# Patient Record
Sex: Female | Born: 1955 | Race: Asian | Hispanic: No | Marital: Single | Smoking: Never smoker
Health system: Southern US, Community
[De-identification: ages and names within clinical notes are randomized; demographics above are authoritative.]

## PROBLEM LIST (undated history)

## (undated) DIAGNOSIS — E7800 Pure hypercholesterolemia, unspecified: Secondary | ICD-10-CM

## (undated) DIAGNOSIS — I10 Essential (primary) hypertension: Secondary | ICD-10-CM

---

## 2010-09-08 ENCOUNTER — Emergency Department (HOSPITAL_COMMUNITY): Payer: Self-pay

## 2010-09-08 ENCOUNTER — Inpatient Hospital Stay (HOSPITAL_COMMUNITY)
Admission: EM | Admit: 2010-09-08 | Discharge: 2010-09-10 | DRG: 305 | Disposition: A | Discharge status: Home / Self Care | Payer: Self-pay | Attending: Family Medicine | Admitting: Family Medicine

## 2010-09-08 ENCOUNTER — Encounter (HOSPITAL_COMMUNITY): Payer: Self-pay | Admitting: Radiology

## 2010-09-08 DIAGNOSIS — E871 Hypo-osmolality and hyponatremia: Secondary | ICD-10-CM

## 2010-09-08 DIAGNOSIS — R5381 Other malaise: Secondary | ICD-10-CM | POA: Diagnosis present

## 2010-09-08 DIAGNOSIS — F1010 Alcohol abuse, uncomplicated: Secondary | ICD-10-CM | POA: Diagnosis present

## 2010-09-08 DIAGNOSIS — I10 Essential (primary) hypertension: Secondary | ICD-10-CM

## 2010-09-08 DIAGNOSIS — H538 Other visual disturbances: Secondary | ICD-10-CM | POA: Diagnosis present

## 2010-09-08 DIAGNOSIS — E876 Hypokalemia: Secondary | ICD-10-CM

## 2010-09-08 DIAGNOSIS — R42 Dizziness and giddiness: Secondary | ICD-10-CM | POA: Diagnosis present

## 2010-09-08 DIAGNOSIS — R5383 Other fatigue: Secondary | ICD-10-CM | POA: Diagnosis present

## 2010-09-08 LAB — DIFFERENTIAL
Basophils Relative: 0 % (ref 0–1)
Eosinophils Relative: 0 % (ref 0–5)
Lymphocytes Relative: 16 % (ref 12–46)
Monocytes Relative: 4 % (ref 3–12)
Neutrophils Relative %: 80 % — ABNORMAL HIGH (ref 43–77)

## 2010-09-08 LAB — URINALYSIS, ROUTINE W REFLEX MICROSCOPIC
Glucose, UA: 250 mg/dL — AB
Ketones, ur: NEGATIVE mg/dL
Protein, ur: NEGATIVE mg/dL
Urobilinogen, UA: 0.2 mg/dL (ref 0.0–1.0)

## 2010-09-08 LAB — CBC
HCT: 35.7 % — ABNORMAL LOW (ref 36.0–46.0)
Hemoglobin: 12.8 g/dL (ref 12.0–15.0)
MCV: 74.4 fL — ABNORMAL LOW (ref 78.0–100.0)
Platelets: 231 10*3/uL (ref 150–400)
RBC: 4.8 MIL/uL (ref 3.87–5.11)
WBC: 10.1 10*3/uL (ref 4.0–10.5)

## 2010-09-08 LAB — COMPREHENSIVE METABOLIC PANEL
AST: 36 U/L (ref 0–37)
BUN: 8 mg/dL (ref 6–23)
CO2: 19 mEq/L (ref 19–32)
Chloride: 82 mEq/L — ABNORMAL LOW (ref 96–112)
Creatinine, Ser: <0.47 mg/dL — ABNORMAL LOW (ref 0.50–1.10)
Glucose, Bld: 112 mg/dL — ABNORMAL HIGH (ref 70–99)
Total Bilirubin: 0.5 mg/dL (ref 0.3–1.2)

## 2010-09-08 LAB — TROPONIN I: Troponin I: <0.3 ng/mL (ref <0.30)

## 2010-09-08 LAB — PREGNANCY, URINE: Preg Test, Ur: NEGATIVE

## 2010-09-09 LAB — COMPREHENSIVE METABOLIC PANEL
BUN: 4 mg/dL — ABNORMAL LOW (ref 6–23)
CO2: 20 mEq/L (ref 19–32)
Chloride: 97 mEq/L (ref 96–112)
Creatinine, Ser: 0.56 mg/dL (ref 0.50–1.10)
GFR calc non Af Amer: >60 mL/min (ref >60)
Glucose, Bld: 115 mg/dL — ABNORMAL HIGH (ref 70–99)
Total Bilirubin: 0.7 mg/dL (ref 0.3–1.2)

## 2010-09-09 LAB — PRO B NATRIURETIC PEPTIDE: Pro B Natriuretic peptide (BNP): 365.2 pg/mL — ABNORMAL HIGH (ref 0–125)

## 2010-09-09 LAB — CBC
MCH: 26.5 pg (ref 26.0–34.0)
MCV: 74.3 fL — ABNORMAL LOW (ref 78.0–100.0)
Platelets: 254 10*3/uL (ref 150–400)
RDW: 13.1 % (ref 11.5–15.5)
WBC: 8.3 10*3/uL (ref 4.0–10.5)

## 2010-09-09 LAB — TROPONIN I: Troponin I: <0.3 ng/mL (ref <0.30)

## 2010-09-09 LAB — BASIC METABOLIC PANEL
BUN: 11 mg/dL (ref 6–23)
Calcium: 9.4 mg/dL (ref 8.4–10.5)
GFR calc non Af Amer: >60 mL/min (ref >60)
Glucose, Bld: 109 mg/dL — ABNORMAL HIGH (ref 70–99)

## 2010-09-09 LAB — CARDIAC PANEL(CRET KIN+CKTOT+MB+TROPI): Troponin I: <0.3 ng/mL (ref <0.30)

## 2010-09-09 LAB — TSH: TSH: 1.158 u[IU]/mL (ref 0.350–4.500)

## 2010-09-09 LAB — CK TOTAL AND CKMB (NOT AT ARMC): Relative Index: 2.4 (ref 0.0–2.5)

## 2010-09-10 LAB — BASIC METABOLIC PANEL
BUN: 16 mg/dL (ref 6–23)
CO2: 23 mEq/L (ref 19–32)
Calcium: 9.7 mg/dL (ref 8.4–10.5)
GFR calc non Af Amer: >60 mL/min (ref >60)
Glucose, Bld: 101 mg/dL — ABNORMAL HIGH (ref 70–99)
Potassium: 4.9 mEq/L (ref 3.5–5.1)

## 2010-09-12 LAB — HCV RNA QUANT

## 2010-09-15 NOTE — H&P (Signed)
Molly Rodriguez, Molly Rodriguez                  ACCOUNT NO.:  [id number]  MEDICAL RECORD NO.:  [id number]  LOCATION:  3735                         FACILITY:  [facility]  PHYSICIAN:  Leighton Roach Lars Jeziorski, M.D.DATE OF BIRTH:  1955-11-08  DATE OF ADMISSION:  09/08/2010 DATE OF DISCHARGE:                             HISTORY & PHYSICAL   PRIMARY CARE PHYSICIAN:  [facility] of [address].  CHIEF COMPLAINT:  Dizziness and blurred vision.  HISTORY OF PRESENT ILLNESS:  This is a 55 year old female with history of apparent health that today in the morning around 10:00 a.m. started to feel dizzy and with blurred vision.  Also complained of generalized weakness not attributable to any particular area of her body.  Felt nauseated, but did not vomit.  No chest pain, no shortness of breath, no headaches.  She was brought by her son-in-law and was found to have blood pressure systolics around 200 and diastolic around 110, and we decided to admit her for further monitoring, study and treatment.  PAST MEDICAL HISTORY:  History of good health and one previous episode of dizziness last year, no related to high blood pressure and no further study.  GYNECOLOGICAL HISTORY:  Last menstrual period in June 2011, G3 P3 A0.  PAST SURGICAL HISTORY:  None.  SOCIAL HISTORY:  The patient lives with her daughter.  She does not work.  Tobacco never.  Alcohol, about 10 cans of beer per week.  Drugs, none.  FAMILY HISTORY:  Mother and father dead, and she does not know the cause.  ALLERGIES:  NKDA.  MEDICATION:  Vitamins.  REVIEW OF SYSTEMS:  Positive for nausea and please refer to the HPI.  PHYSICAL EXAMINATION:  VITAL SIGNS:  Temperature 97, pulse 86, respirations 18, blood pressure between 202-179 systolic and diastolic between 409-81, oxygen saturation 100% on room air. GENERAL:  NAD. HEENT:  PERL, EOMI.  Funduscopy, no hemorrhage or papilledema seen. NECK:  Supple.  No adenopathy. CARDIOVASCULAR:  RRR.  No  murmurs. LUNGS:  Breath sounds normal bilaterally.  No rales. ABDOMEN:  Soft, nontender, nondistended. EXTREMITIES:  No edema. NEURO:  Oriented x3.  Cranial nerves II through XII normal.  Strength 5/5.  Sensation normal.  Reflexes present and normal. Coordination, finger-to-nose, finger-to-finger, and rapid alternating movements conserved.  Did not stand up because of her dizziness.  LABORATORY DATA AND STUDIES:  Hemoglobin is 12.8, white count is 10.1, platelets is 231.  Sodium 120, potassium 3.1, chloride 82, CO2 of 19, BUN 8, creatinine 0.47, glucose 112, calcium 8.1, albumin 4.2.  Cardiac enzymes, troponin, and CK-MB x1 negative.  Urine negative and urine pregnancy test negative.  CT without contrast, normal appearing.  No evidence of hemorrhage, infarction, or mass lesion.  Chest x-ray, mild pulmonary vascular congestion.  ASSESSMENT AND PLAN:  This is a 55 year old female with a high blood pressure and dizziness. 1. Hypertensive urgency.  Blood pressure 200/110  mmHg.  Even though,     the patient has symptoms of dizziness and blurred vision, her     neurologic exam was negative for signs of focalization.  Her     funduscopic exam was negative.  CT scan was normal.  She  never had     chest pain and enzymes x1 were negative.  EKG showed borderline     depression of ST on lateral lead, but no prior EKGs and this could     be due to heart strain for her blood pressure.  Renal function     adequate.  Chest x-ray negative.  We will monitor her BP and start     antihypertensive treatment for blood pressure goals around 20%     reduction. 2. Hypertension plus hypokalemia.  We will include our differential as     primary aldosteronism, but first, we will replete potassium and     monitor.  We will assess if further studies are needed. 3. Hyponatremia combined with hypokalemia.  There are no signs of     fluid overload or losses.  We will replete sodium with normal     saline and  reevaluate.  Could be associated with SIADH or primary     polydipsia (euvolemic hyponatremia or compensatory for primary     hypoaldosteronism). 4. EtOH tox related to alcohol.  The patient drinks beer in frequency     enough to assess this in our plan.  CIWA protocol was started and     liver function test for tomorrow.  Platelets are normal. 5. Fluids, electrolytes, nutrition/gastrointestinal.  Heart-healthy     diet and normal saline at 100 mL per hour. 6. Prophylaxis.  Heparin subcu. 7. Disposition.  Pending improvement of symptoms and blood pressure     management.    ______________________________ [name], MD   ______________________________ Leighton Roach Jawon Dipiero, M.D.    DP/MEDQ  D:  09/09/2010  T:  09/09/2010  Job:  [id number]  Electronically Signed by Delorse Lek PAZ  on 09/12/2010 11:44:52 PM Electronically Signed by Acquanetta Belling M.D. on 09/15/2010 04:45:41 PM

## 2010-09-15 NOTE — Discharge Summary (Signed)
NAMEDESPINA, BOAN                  ACCOUNT NO.:  [id number]  MEDICAL RECORD NO.:  [id number]  LOCATION:  3735                         FACILITY:  [facility]  PHYSICIAN:  Leighton Roach Kenzee Bassin, M.D.DATE OF BIRTH:  [birth year]  DATE OF ADMISSION:  09/08/2010 DATE OF DISCHARGE:  09/10/2010                              DISCHARGE SUMMARY   DISCHARGE DIAGNOSES: 1. Hypertensive urgency. 2. Hypokalemia and hyponatremia. 3. History of alcohol abuse.  DISCHARGE MEDICATIONS: 1. Thiamine 100 mg tablet one p.o. daily. 2. Folic acid 1 mg tablet p.o. daily. 3. Fish oil OTC.  CONSULTS:  None.  LABS: 1. Sodium 120-137 today, potassium 3.1-4.6 today, A1c 5.6, TSH 1.158,     hepatitis B indeterminate. 2. Pro BNP 365. 3. Cardiac enzymes negative x3. 4. Urinary pregnancy test negative. 5. Uterine microscopy negative. 6. Osmolality on serum 261, on urine 356.  PERTINENT STUDIES: 1. CT of the head without contrast performed on July 13 which showed     normal brain. 2. Chest x-ray performed July 13 showed mild pulmonary vascular     congestion and clear lungs.  BRIEF HOSPITAL COURSE:  This is a 55 year old female, apparently healthy in the past that is admitted for dizziness and blood pressure of 200/110 mgHg. 1. Hypertensive emergency.  The patient without signs of organ     dysfunction.  CT of head, chest x-ray, and cardiac enzymes are all     negative.  Blood pressure responded well to metoprolol (two doses     of 25 mg) with adequate reduction to normal value by the second day     of hospitalization.  The patient was started on lisinopril 10 mg on     July 14 that was stopped the next day due to stabilization of her     blood pressure.  We discharge her and explained she needed to     monitor blood pressure at least twice a day and follow up as     outpatient for better control. 2. Hypokalemia, hyponatremia, with low osmolality of serum and urine.     Responded well to normal saline and  potassium repletion with two     BMET of normal values of 9 hours apart.  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 3. History of alcohol abuse.  It was very hard to obtain a good     history.  The patient was CAGE positive, but then she said that she     used to drink in the past, not now.  She was on CIWA protocol and     is sent home with thiamine and folic acid.  These could be assessed     as an outpatient with more detail. 4. Hepatitis B surface antibody indeterminate.  We would recommend     more extensive hepatitis panel.  DISCHARGE INSTRUCTIONS: 1. Activity:  Return to daily activities. 2. Diet:  Heart healthy diet. 3. Appointment with her PCP at [facility] or at [facility]  Practice Service if she register with [place].  The patient     is discharged home on stable medical condition.    ______________________________ Wayne Both, MD   ______________________________ Etta Grandchild, M.D.    DP/MEDQ  D:  09/11/2010  T:  09/11/2010  Job:  [id number]  Electronically Signed by Delorse Lek PAZ  on 09/12/2010 11:46:34 PM Electronically Signed by Acquanetta Belling M.D. on 09/15/2010 04:46:19 PM

## 2010-11-06 ENCOUNTER — Ambulatory Visit (INDEPENDENT_AMBULATORY_CARE_PROVIDER_SITE_OTHER): Payer: Self-pay | Admitting: Family Medicine

## 2010-11-06 ENCOUNTER — Encounter: Payer: Self-pay | Admitting: Family Medicine

## 2010-11-06 VITALS — BP 173/99 | HR 90 | Temp 98.4°F | Ht 59.6 in | Wt 107.0 lb

## 2010-11-06 DIAGNOSIS — I10 Essential (primary) hypertension: Secondary | ICD-10-CM

## 2010-11-06 DIAGNOSIS — R300 Dysuria: Secondary | ICD-10-CM

## 2010-11-06 DIAGNOSIS — Z23 Encounter for immunization: Secondary | ICD-10-CM

## 2010-11-06 LAB — POCT URINALYSIS DIPSTICK
Bilirubin, UA: NEGATIVE
Blood, UA: NEGATIVE
Nitrite, UA: NEGATIVE
Urobilinogen, UA: 0.2
pH, UA: 7

## 2010-11-06 MED ORDER — HYDROCHLOROTHIAZIDE 25 MG PO TABS: 25.0000 mg | ORAL_TABLET | Freq: Every day | ORAL | Status: DC

## 2010-11-06 NOTE — Patient Instructions (Addendum)
It has been a pleasure to see you again! Please take Hydrochlorothiazide 25 mg daily. Make a lab appointment and once I receive the results I will call you if they are abnormal, otherwise you will receive a letter. Make a f/u appointment in a month.

## 2010-11-12 DIAGNOSIS — R300 Dysuria: Secondary | ICD-10-CM | POA: Insufficient documentation

## 2010-11-12 NOTE — Assessment & Plan Note (Signed)
Dysuria for 3 weeks. No flank pain, no fever or chills. Plan: Urinalysis  (dip stick)

## 2010-11-12 NOTE — Assessment & Plan Note (Addendum)
Had a admission in [facility] on July/12 due to Hypertensive Urgency. Did not have PCP. Stablish care today with [place]. HTN. Plan: Hydrochlorothiazide 25 mg daily. Cmet, Lipid Profile. Monitor BP every other day for two weeks. F/u in 4  weeks

## 2010-11-12 NOTE — Progress Notes (Signed)
  Subjective:    Patient ID: Molly Rodriguez, female    DOB: 08-02-1955, 55 y.o.   MRN: [id number]  HPI Pt that was admitted last July for Hypertensive Urgency. (200/110 with no signs of organ damage). That is establishing care with [place] today (no prior PCP). Has history of Etoh use but stopped after her hospitalization. In ED BP responded to beta-blockers. Once admitted she was started on lisinopril  but she responded with low blood pressures and stopping medication was required. Pt maintain normal BP without drug therapy and was discharge home.  She comes today c/o dysuria for about 3 weeks, afebrile, no chills, no abdominal or back pain. Has taken her BP but monitor was not reliable.  BP today is 173/99 and 10 min later 160/ 100. No chest pain, palpitations, SOB, vomiting or change in her BM habits. No changes in appetite or in her weight.  Review of Systems Please refer to HPI    Objective:   Physical Exam  Constitutional: She is oriented to person, place, and time. No distress.  HENT:  Head: Normocephalic and atraumatic.  Eyes: EOM are normal. Pupils are equal, round, and reactive to light.  Neck: Neck supple. No thyromegaly present.  Cardiovascular: Normal rate, regular rhythm and normal heart sounds.   No murmur heard. Pulmonary/Chest: Breath sounds normal.  Abdominal: Soft. Bowel sounds are normal. She exhibits no distension and no mass. There is no tenderness.  Genitourinary:       No costovertebral tenderness  Musculoskeletal: She exhibits no edema.  Neurological: She is alert and oriented to person, place, and time. No cranial nerve deficit. Coordination normal.  Psychiatric: She has a normal mood and affect. Her behavior is normal.          Assessment & Plan:

## 2010-11-13 ENCOUNTER — Encounter: Payer: Self-pay | Admitting: Family Medicine

## 2010-11-15 ENCOUNTER — Other Ambulatory Visit: Payer: Self-pay

## 2011-03-05 ENCOUNTER — Other Ambulatory Visit: Payer: Self-pay | Admitting: Family Medicine

## 2011-03-06 NOTE — Telephone Encounter (Signed)
Refill request

## 2011-06-04 ENCOUNTER — Other Ambulatory Visit: Payer: Self-pay | Admitting: Family Medicine

## 2011-09-01 ENCOUNTER — Other Ambulatory Visit: Payer: Self-pay | Admitting: Family Medicine

## 2011-11-29 ENCOUNTER — Other Ambulatory Visit: Payer: Self-pay | Admitting: Family Medicine

## 2011-12-05 IMAGING — CT CT HEAD W/O CM
1 of 2 series · 13 of 30 positions shown, 17 images · non-contrast
Comparison: None

CLINICAL DATA: Dizziness and blurred vision

CT HEAD WITHOUT CONTRAST
TECHNIQUE: Contiguous axial images were obtained from the base of
the skull through the vertex without contrast.

[Series 2: brain · axial · 0.47mm/px · z∈[+121,+237]mm · 13 of 36 slices shown, 17 images]
[im 3/36  brain]
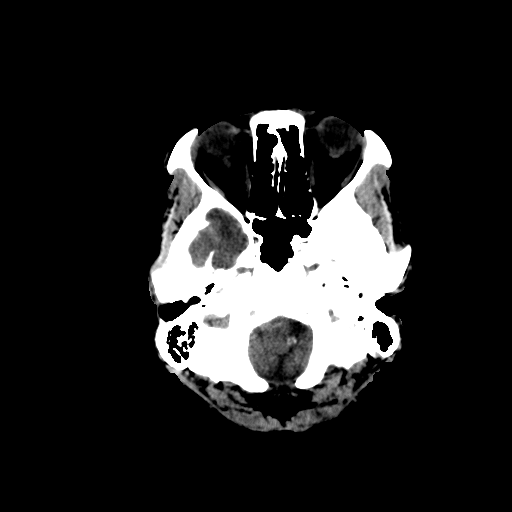
[im 3/36  bone]
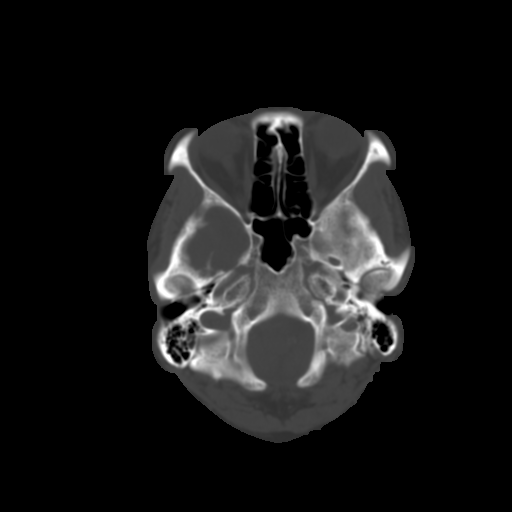
[im 6/36  brain]
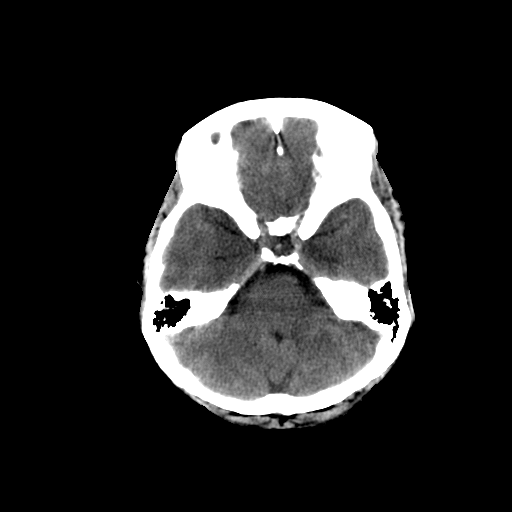
[im 8/36  brain]
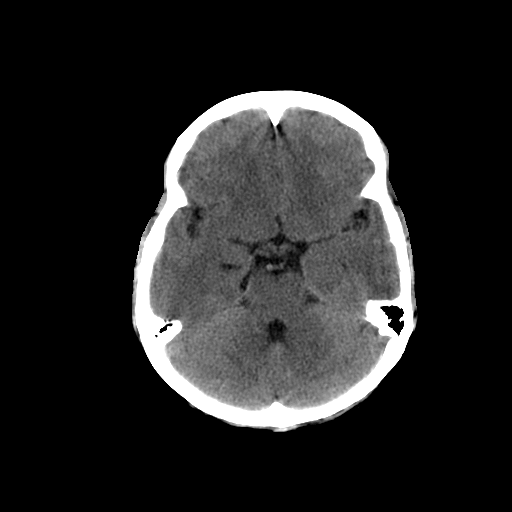
[im 11/36  brain]
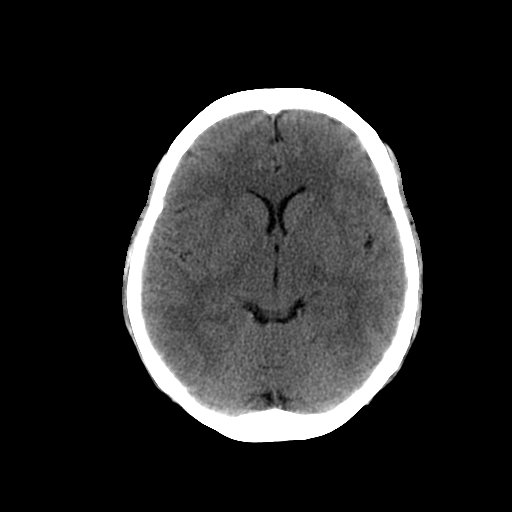
[im 13/36  brain]
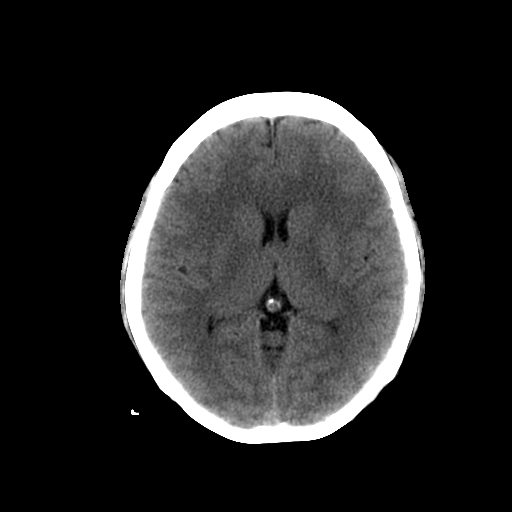
[im 13/36  bone]
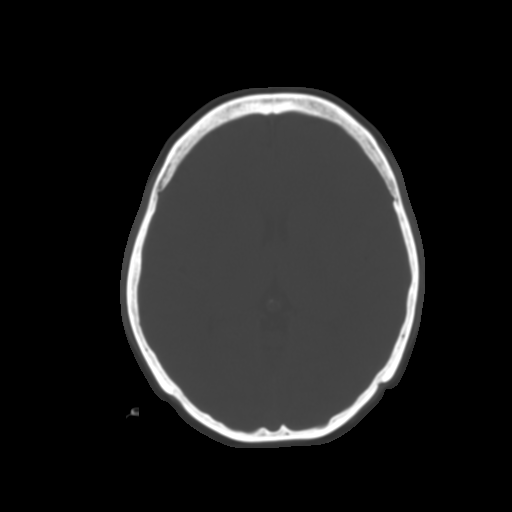
[im 16/36  brain]
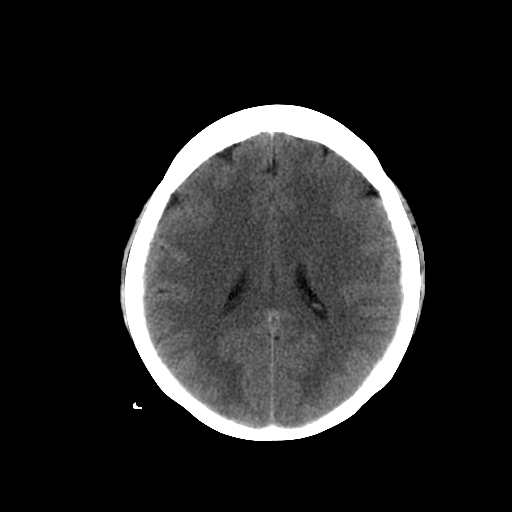
[im 18/36  brain]
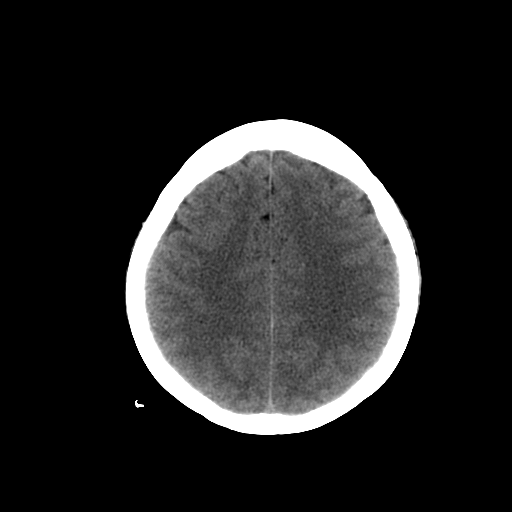
[im 21/36  brain]
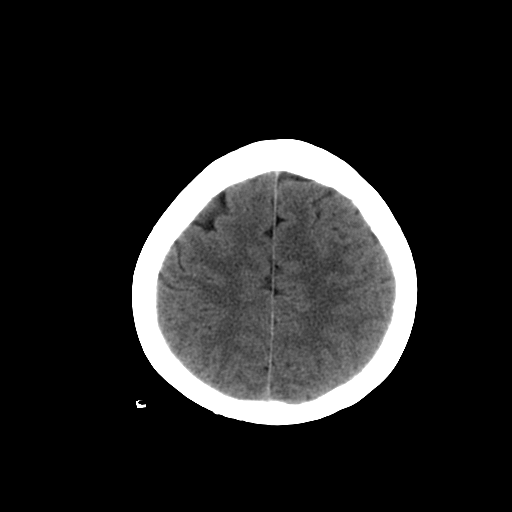
[im 23/36  brain]
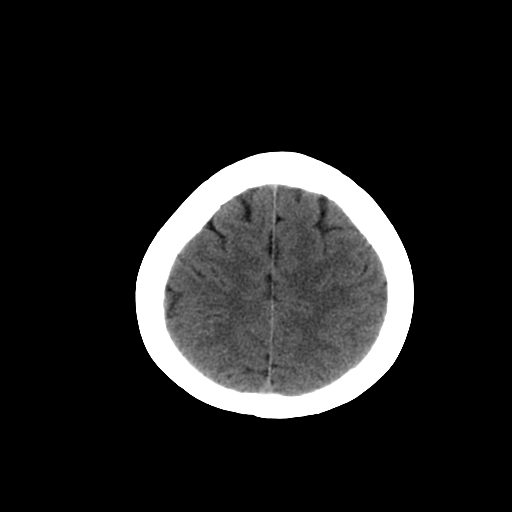
[im 23/36  bone]
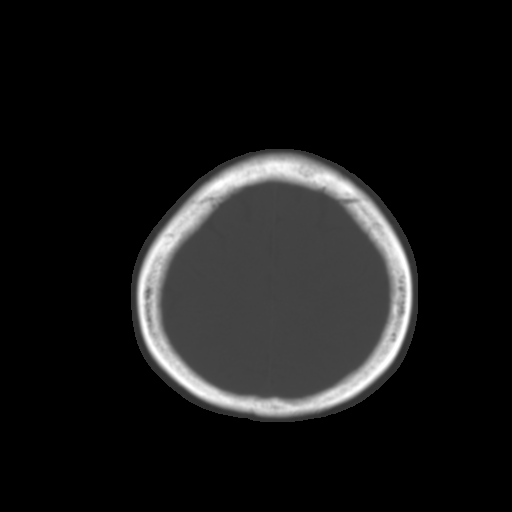
[im 26/36  brain]
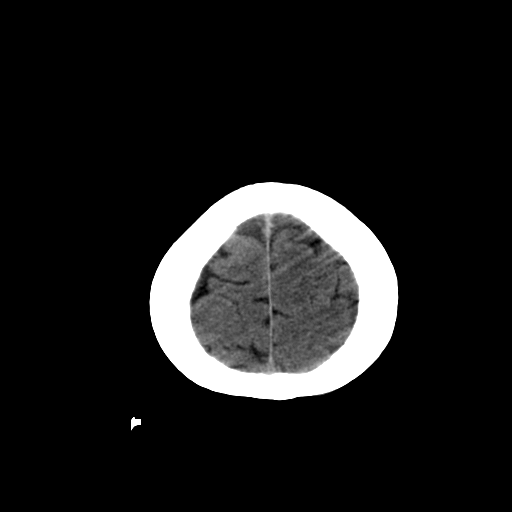
[im 28/36  brain]
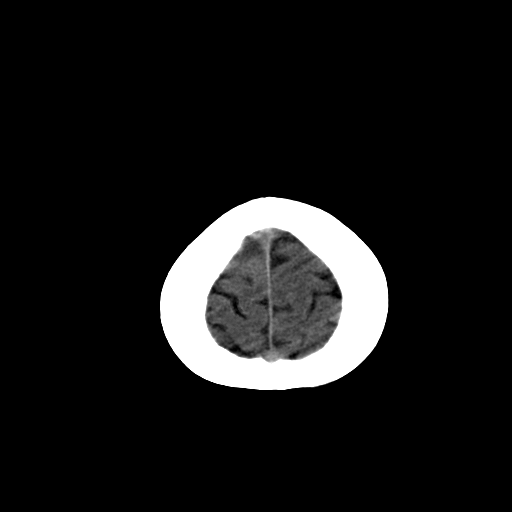
[im 31/36  brain]
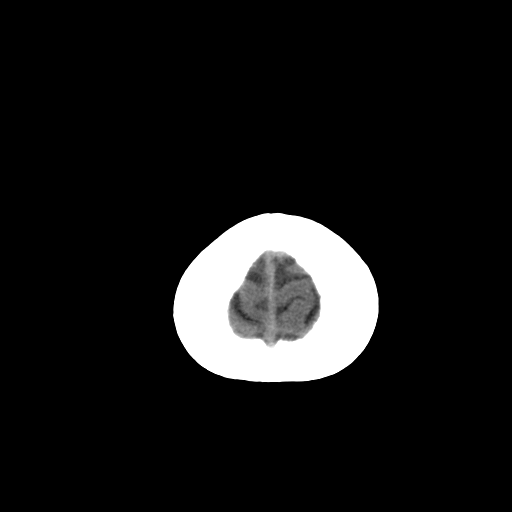
[im 33/36  brain]
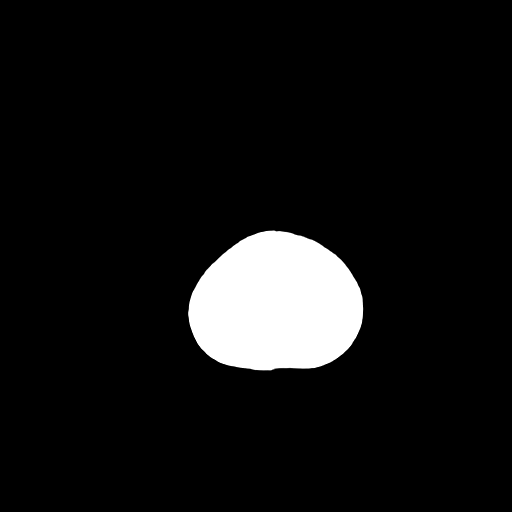
[im 33/36  bone]
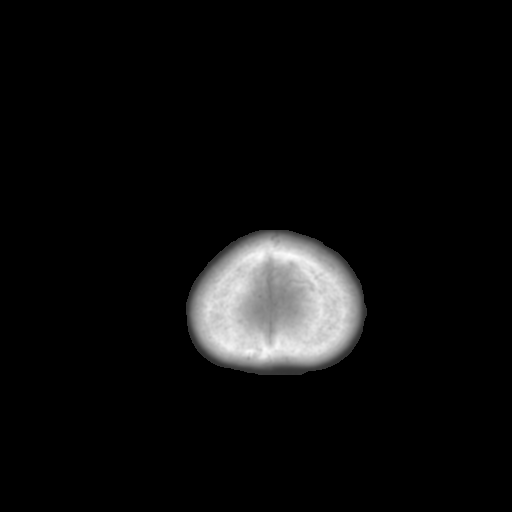

[13 of 30 positions shown; findings below may reference images not displayed]

FINDINGS: The brain has a normal appearance without evidence for
hemorrhage, infarction, hydrocephalus, or mass lesion.  There is no
extra axial fluid collection.  The skull and paranasal sinuses are
normal.
IMPRESSION: Normal brain.

## 2013-03-30 ENCOUNTER — Ambulatory Visit: Payer: Self-pay | Attending: Internal Medicine

## 2015-11-14 ENCOUNTER — Encounter: Payer: Self-pay | Admitting: Family Medicine

## 2015-11-14 ENCOUNTER — Ambulatory Visit (INDEPENDENT_AMBULATORY_CARE_PROVIDER_SITE_OTHER): Payer: Self-pay | Admitting: Family Medicine

## 2015-11-14 VITALS — BP 197/93 | HR 112 | Temp 98.3°F | Resp 16 | Ht 60.0 in | Wt 107.0 lb

## 2015-11-14 DIAGNOSIS — Z23 Encounter for immunization: Secondary | ICD-10-CM

## 2015-11-14 DIAGNOSIS — I10 Essential (primary) hypertension: Secondary | ICD-10-CM

## 2015-11-14 DIAGNOSIS — R05 Cough: Secondary | ICD-10-CM

## 2015-11-14 DIAGNOSIS — R059 Cough, unspecified: Secondary | ICD-10-CM

## 2015-11-14 DIAGNOSIS — Z1239 Encounter for other screening for malignant neoplasm of breast: Secondary | ICD-10-CM

## 2015-11-14 LAB — LIPID PANEL
Cholesterol: 205 mg/dL — ABNORMAL HIGH (ref 125–200)
HDL: 87 mg/dL (ref >=46)
LDL CALC: 86 mg/dL (ref <130)
TRIGLYCERIDES: 161 mg/dL — AB (ref <150)
Total CHOL/HDL Ratio: 2.4 Ratio (ref <=5.0)
VLDL: 32 mg/dL — AB (ref <30)

## 2015-11-14 MED ORDER — LISINOPRIL 20 MG PO TABS: 20.0000 mg | ORAL_TABLET | Freq: Every day | ORAL | 0 refills | Status: DC

## 2015-11-14 NOTE — Patient Instructions (Addendum)
Come back in one month for nurse visit for blood pressure check Take HCTZ and lisinopril daily for high blood pressure.  Watch salt in diet Work on getting [facility] [facility] so we can send to Ear, nose, throat doctor about the cough.

## 2015-11-15 LAB — HIV ANTIBODY (ROUTINE TESTING W REFLEX): HIV 1&2 Ab, 4th Generation: NONREACTIVE

## 2015-11-15 LAB — HEMOGLOBIN A1C
Hgb A1c MFr Bld: 5.6 % (ref <5.7)
Mean Plasma Glucose: 114 mg/dL

## 2015-11-16 NOTE — Progress Notes (Signed)
Molly Rodriguez, is a 60 y.o. female  ZOX:[id number]  [id number]  DOB - 05/15/1955  CC:  Chief Complaint  Patient presents with  . Establish Care  . Hypertension    taken medication at 7am.   . Cough    dry cough        HPI: Molly Rodriguez is a 60 y.o. female here to establish care. She has a diagnosis of hypertension and is on HCTZ 25 mg. Her BP today is 201/105. A repeat pressure was 197/93. She denies any further chronic illnesses but does complain of a chronic cough that originates in the lower throat. This has been present for a couple of years. She is unaware of what makes it better or worse. She denies heartburn.   Health maintenance: She will be given flu shot today. Is unaware of Tdap status. She needs screening for HIV and HCV. She needs a mammogram and a PAP smear.She reports drinking 1-2 beers a day. Denies tobacco or drug use. She also needs colon cancer screening  No Known Allergies History reviewed. No pertinent past medical history. Current Outpatient Prescriptions on File Prior to Visit  Medication Sig Dispense Refill  . hydrochlorothiazide (HYDRODIURIL) 25 MG tablet TAKE 1 TABLET BY MOUTH EVERY DAY 30 tablet 2   No current facility-administered medications on file prior to visit.    History reviewed. No pertinent family history. Social History   Social History  . Marital status: Single    Spouse name: N/A  . Number of children: N/A  . Years of education: N/A   Occupational History  . Not on file.   Social History Main Topics  . Smoking status: Never Smoker  . Smokeless tobacco: Never Used  . Alcohol use 1.2 oz/week    2 Cans of beer per week     Comment: daily  . Drug use: No  . Sexual activity: Yes    Birth control/ protection: Post-menopausal   Other Topics Concern  . Not on file   Social History Narrative  . No narrative on file    Review of Systems: Constitutional: Negative Skin: Negative HENT: Negative  Eyes: Negative  Neck:  Negative Respiratory: Negative Cardiovascular: Negative Gastrointestinal: Negative Genitourinary: Negative  Musculoskeletal: Negative   Neurological: Negative for Hematological: Negative  Psychiatric/Behavioral: Negative    Objective:   Vitals:   11/14/15 1105 11/14/15 1150  BP: (!) 201/105 (!) 197/93  Pulse:    Resp:    Temp:      Physical Exam: Constitutional: Patient appears well-developed and well-nourished. No distress. HENT: Normocephalic, atraumatic, External right and left ear normal. Oropharynx is clear and moist.  Eyes: Conjunctivae and EOM are normal. PERRLA, no scleral icterus. Neck: Normal ROM. Neck supple. No lymphadenopathy, No thyromegaly. CVS: RRR, S1/S2 +, no murmurs, no gallops, no rubs Pulmonary: Effort and breath sounds normal, no stridor, rhonchi, wheezes, rales.  Abdominal: Soft. Normoactive BS,, no distension, tenderness, rebound or guarding.  Musculoskeletal: Normal range of motion. No edema and no tenderness.  Neuro: Alert.Normal muscle tone coordination. Non-focal Skin: Skin is warm and dry. No rash noted. Not diaphoretic. No erythema. No pallor. Psychiatric: Normal mood and affect. Behavior, judgment, thought content normal.  Lab Results  Component Value Date   WBC 8.3 09/09/2010   HGB 12.8 09/09/2010   HCT 35.9 (L) 09/09/2010   MCV 74.3 (L) 09/09/2010   PLT 254 09/09/2010   Lab Results  Component Value Date   CREATININE 0.75 09/10/2010   BUN 16 09/10/2010  NA 139 09/10/2010   K 4.9 09/10/2010   CL 106 09/10/2010   CO2 23 09/10/2010    Lab Results  Component Value Date   HGBA1C 5.6 11/14/2015   Lipid Panel     Component Value Date/Time   CHOL 205 (H) 11/14/2015 1150   TRIG 161 (H) 11/14/2015 1150   HDL 87 11/14/2015 1150   CHOLHDL 2.4 11/14/2015 1150   VLDL 32 (H) 11/14/2015 1150   LDLCALC 86 11/14/2015 1150       Assessment and plan:   1. Encounter for immunization  - Flu Vaccine QUAD 36+ mos IM  2. Essential  hypertension  - Hemoglobin A1c - Lipid panel - HIV antibody (with reflex) -Addition of lisinopril 20 mg, q day.When she completes her current supply of hctz, will provide combination.  3. Screening for breast cancer  - MM DIGITAL SCREENING BILATERAL; Future  4. Colon cancer screening -stool cards.      Return in about 4 weeks (around 12/12/2015) for for nurse visit for BP check., HTN.  The patient was given clear instructions to go to ER or return to medical center if symptoms don't improve, worsen or new problems develop. The patient verbalized understanding.    Henrietta Hoover FNP  11/16/2015, 8:13 AM

## 2015-12-12 ENCOUNTER — Ambulatory Visit (INDEPENDENT_AMBULATORY_CARE_PROVIDER_SITE_OTHER): Payer: Self-pay | Admitting: Family Medicine

## 2015-12-12 VITALS — BP 191/109 | HR 118

## 2015-12-12 DIAGNOSIS — I10 Essential (primary) hypertension: Secondary | ICD-10-CM

## 2015-12-12 MED ORDER — LISINOPRIL-HYDROCHLOROTHIAZIDE 20-25 MG PO TABS: 1.0000 | ORAL_TABLET | Freq: Every day | ORAL | 1 refills | Status: DC

## 2015-12-12 MED ORDER — AMLODIPINE BESYLATE 10 MG PO TABS: 10.0000 mg | ORAL_TABLET | Freq: Every day | ORAL | 1 refills | Status: DC

## 2015-12-12 NOTE — Progress Notes (Signed)
Patient states she took both of her medications this morning at 7 am. She needs a refill of her lisinopril.   Advised patient per Concepcion LivingLinda Bernhardt, NP that we are adding a medication and giving her a combination of her 2 current meds.   She needs to return in 2 weeks for blood pressure recheck.

## 2015-12-15 ENCOUNTER — Encounter (HOSPITAL_COMMUNITY): Payer: Self-pay | Admitting: *Deleted

## 2015-12-15 ENCOUNTER — Inpatient Hospital Stay (HOSPITAL_COMMUNITY): Payer: Self-pay

## 2015-12-15 ENCOUNTER — Inpatient Hospital Stay (HOSPITAL_COMMUNITY)
Admission: EM | Admit: 2015-12-15 | Discharge: 2015-12-17 | DRG: 641 | Disposition: A | Discharge status: Home / Self Care | Payer: Self-pay | Attending: Family Medicine | Admitting: Family Medicine

## 2015-12-15 ENCOUNTER — Other Ambulatory Visit: Payer: Self-pay

## 2015-12-15 DIAGNOSIS — E785 Hyperlipidemia, unspecified: Secondary | ICD-10-CM | POA: Diagnosis present

## 2015-12-15 DIAGNOSIS — R42 Dizziness and giddiness: Secondary | ICD-10-CM | POA: Diagnosis present

## 2015-12-15 DIAGNOSIS — F1021 Alcohol dependence, in remission: Secondary | ICD-10-CM

## 2015-12-15 DIAGNOSIS — R631 Polydipsia: Secondary | ICD-10-CM | POA: Diagnosis present

## 2015-12-15 DIAGNOSIS — R112 Nausea with vomiting, unspecified: Secondary | ICD-10-CM

## 2015-12-15 DIAGNOSIS — E876 Hypokalemia: Secondary | ICD-10-CM | POA: Diagnosis present

## 2015-12-15 DIAGNOSIS — I10 Essential (primary) hypertension: Secondary | ICD-10-CM | POA: Diagnosis present

## 2015-12-15 DIAGNOSIS — E7800 Pure hypercholesterolemia, unspecified: Secondary | ICD-10-CM | POA: Diagnosis present

## 2015-12-15 DIAGNOSIS — E871 Hypo-osmolality and hyponatremia: Principal | ICD-10-CM | POA: Diagnosis present

## 2015-12-15 DIAGNOSIS — F458 Other somatoform disorders: Secondary | ICD-10-CM | POA: Diagnosis present

## 2015-12-15 DIAGNOSIS — Z79899 Other long term (current) drug therapy: Secondary | ICD-10-CM

## 2015-12-15 HISTORY — DX: Essential (primary) hypertension: I10

## 2015-12-15 HISTORY — DX: Pure hypercholesterolemia, unspecified: E78.00

## 2015-12-15 LAB — URINALYSIS, ROUTINE W REFLEX MICROSCOPIC
BILIRUBIN URINE: NEGATIVE
Glucose, UA: NEGATIVE mg/dL
KETONES UR: 15 mg/dL — AB
Leukocytes, UA: NEGATIVE
NITRITE: NEGATIVE
PROTEIN: 30 mg/dL — AB
SPECIFIC GRAVITY, URINE: 1.016 (ref 1.005–1.030)
pH: 5.5 (ref 5.0–8.0)

## 2015-12-15 LAB — CBC
HCT: 42 % (ref 36.0–46.0)
Hemoglobin: 14.8 g/dL (ref 12.0–15.0)
MCH: 25.9 pg — ABNORMAL LOW (ref 26.0–34.0)
MCHC: 35.2 g/dL (ref 30.0–36.0)
MCV: 73.4 fL — AB (ref 78.0–100.0)
PLATELETS: 307 10*3/uL (ref 150–400)
RBC: 5.72 MIL/uL — AB (ref 3.87–5.11)
RDW: 12.4 % (ref 11.5–15.5)
WBC: 11.2 10*3/uL — AB (ref 4.0–10.5)

## 2015-12-15 LAB — URINE MICROSCOPIC-ADD ON

## 2015-12-15 LAB — SODIUM, URINE, RANDOM: Sodium, Ur: 159 mmol/L

## 2015-12-15 LAB — BASIC METABOLIC PANEL
Anion gap: 15 (ref 5–15)
BUN: 8 mg/dL (ref 6–20)
CHLORIDE: 85 mmol/L — AB (ref 101–111)
CO2: 22 mmol/L (ref 22–32)
CREATININE: 0.7 mg/dL (ref 0.44–1.00)
Calcium: 9.9 mg/dL (ref 8.9–10.3)
Glucose, Bld: 132 mg/dL — ABNORMAL HIGH (ref 65–99)
POTASSIUM: 2.9 mmol/L — AB (ref 3.5–5.1)
SODIUM: 122 mmol/L — AB (ref 135–145)

## 2015-12-15 LAB — OSMOLALITY, URINE: Osmolality, Ur: 457 mOsm/kg (ref 300–900)

## 2015-12-15 LAB — I-STAT TROPONIN, ED: Troponin i, poc: 0.01 ng/mL (ref 0.00–0.08)

## 2015-12-15 LAB — OSMOLALITY: OSMOLALITY: 254 mosm/kg — AB (ref 275–295)

## 2015-12-15 MED ORDER — VITAMIN B-1 100 MG PO TABS
100.0000 mg | ORAL_TABLET | Freq: Every day | ORAL | Status: DC
  Administered 2015-12-15 – 2015-12-17 (×3): 100 mg via ORAL
  Filled 2015-12-15 (×3): qty 1

## 2015-12-15 MED ORDER — THIAMINE HCL 100 MG/ML IJ SOLN
100.0000 mg | Freq: Every day | INTRAMUSCULAR | Status: DC
  Filled 2015-12-15 (×2): qty 2

## 2015-12-15 MED ORDER — SODIUM CHLORIDE 0.9 % IV BOLUS (SEPSIS)
1000.0000 mL | Freq: Once | INTRAVENOUS | Status: AC
  Administered 2015-12-15: 1000 mL via INTRAVENOUS

## 2015-12-15 MED ORDER — ENOXAPARIN SODIUM 40 MG/0.4ML ~~LOC~~ SOLN
40.0000 mg | Freq: Every day | SUBCUTANEOUS | Status: DC
Start: 2015-12-15 — End: 2015-12-17
  Administered 2015-12-16 (×2): 40 mg via SUBCUTANEOUS
  Filled 2015-12-15 (×2): qty 0.4

## 2015-12-15 MED ORDER — AMLODIPINE BESYLATE 10 MG PO TABS
10.0000 mg | ORAL_TABLET | Freq: Every day | ORAL | Status: DC
  Administered 2015-12-16 – 2015-12-17 (×2): 10 mg via ORAL
  Filled 2015-12-15 (×2): qty 1

## 2015-12-15 MED ORDER — LISINOPRIL 20 MG PO TABS
20.0000 mg | ORAL_TABLET | Freq: Every day | ORAL | Status: DC
  Administered 2015-12-16 – 2015-12-17 (×2): 20 mg via ORAL
  Filled 2015-12-15 (×3): qty 1

## 2015-12-15 MED ORDER — SODIUM CHLORIDE 0.9 % IV SOLN
INTRAVENOUS | Status: DC
  Administered 2015-12-15 – 2015-12-17 (×4): via INTRAVENOUS

## 2015-12-15 MED ORDER — POTASSIUM CHLORIDE 10 MEQ/100ML IV SOLN
10.0000 meq | Freq: Once | INTRAVENOUS | Status: AC
  Administered 2015-12-15: 10 meq via INTRAVENOUS
  Filled 2015-12-15: qty 100

## 2015-12-15 MED ORDER — ADULT MULTIVITAMIN W/MINERALS CH
1.0000 | ORAL_TABLET | Freq: Every day | ORAL | Status: DC
  Administered 2015-12-15 – 2015-12-17 (×3): 1 via ORAL
  Filled 2015-12-15 (×3): qty 1

## 2015-12-15 MED ORDER — ONDANSETRON HCL 4 MG/2ML IJ SOLN
4.0000 mg | Freq: Once | INTRAMUSCULAR | Status: AC
  Administered 2015-12-15: 4 mg via INTRAVENOUS
  Filled 2015-12-15: qty 2

## 2015-12-15 MED ORDER — POTASSIUM CHLORIDE CRYS ER 20 MEQ PO TBCR
40.0000 meq | EXTENDED_RELEASE_TABLET | Freq: Once | ORAL | Status: AC
  Administered 2015-12-15: 40 meq via ORAL
  Filled 2015-12-15: qty 2

## 2015-12-15 MED ORDER — FOLIC ACID 1 MG PO TABS
1.0000 mg | ORAL_TABLET | Freq: Every day | ORAL | Status: DC
  Administered 2015-12-15 – 2015-12-17 (×3): 1 mg via ORAL
  Filled 2015-12-15 (×3): qty 1

## 2015-12-15 NOTE — H&P (Addendum)
History and Physical    Molly Rodriguez Munger [id number] DOB: 02/12/1956 DOA: 12/15/2015   PCP: Concepcion LivingBERNHARDT, LINDA, NP Chief Complaint:  Chief Complaint  Patient presents with  . Dizziness  . Weakness  . Nausea  . Emesis    HPI: Molly Rodriguez is a 60 y.o. female with medical history significant of hypertension and hyperlipidemia. Today, she has felt weak with nausea. She has vomited several times. She states that her throat feels dry and she's been drinking a lot of water but the amount of water she is drinking is been limited by her nausea.  Of note per family she drinks up to 3.5L of water a day just between tea and bottled water.  She denies hurting anywhere.  There is no known fever and no chills or sweats. Of note, she has had her antihypertensive medications changed.  She was seen at a clinic 1 month ago and had lisinopril added to her HCTZ. She was seen 3 days ago and blood pressure was still elevated and she was started on amlodipine. She did call the clinic today who suggested that she stop her blood pressure medications for 2 days. However, she was feeling worse and they came to the ED.  ED Course: Sodium 122, K 2.9, Cl also low.  Given 1L bolus, BP 160s systolic.  Review of Systems: As per HPI otherwise 10 point review of systems negative.    Past Medical History:  Diagnosis Date  . Hypercholesteremia   . Hypertension     History reviewed. No pertinent surgical history.   reports that she has never smoked. She has never used smokeless tobacco. She reports that she drinks about 1.2 oz of alcohol per week . She reports that she does not use drugs.  No Known Allergies  History reviewed. No pertinent family history. No h/o psychogenic polydipsia.   Prior to Admission medications   Medication Sig Start Date End Date Taking? Authorizing Provider  amLODipine (NORVASC) 10 MG tablet Take 1 tablet (10 mg total) by mouth daily. 12/12/15  Yes Henrietta HooverLinda C Bernhardt, NP    Physical  Exam: Vitals:   12/15/15 1710 12/15/15 1842  BP: 175/99 170/99  Pulse: 110 98  Resp: 20 18  Temp: 98.2 F (36.8 C)   TempSrc: Oral   SpO2: 98% 100%  Weight: 48.5 kg (107 lb)   Height: 4\' 11"  (1.499 m)       Constitutional: NAD, calm, comfortable Eyes: PERRL, lids and conjunctivae normal ENMT: Mucous membranes are moist. Posterior pharynx clear of any exudate or lesions.Normal dentition.  Neck: normal, supple, no masses, no thyromegaly Respiratory: clear to auscultation bilaterally, no wheezing, no crackles. Normal respiratory effort. No accessory muscle use.  Cardiovascular: Regular rate and rhythm, no murmurs / rubs / gallops. No extremity edema. 2+ pedal pulses. No carotid bruits.  Abdomen: no tenderness, no masses palpated. No hepatosplenomegaly. Bowel sounds positive.  Musculoskeletal: no clubbing / cyanosis. No joint deformity upper and lower extremities. Good ROM, no contractures. Normal muscle tone.  Skin: no rashes, lesions, ulcers. No induration Neurologic: CN 2-12 grossly intact. Sensation intact, DTR normal. Strength 5/5 in all 4.  Psychiatric: Normal judgment and insight. Alert and oriented x 3. Normal mood.    Labs on Admission: I have personally reviewed following labs and imaging studies  CBC:  Recent Labs Lab 12/15/15 1722  WBC 11.2*  HGB 14.8  HCT 42.0  MCV 73.4*  PLT 307   Basic Metabolic Panel:  Recent Labs Lab 12/15/15 1722  NA 122*  K 2.9*  CL 85*  CO2 22  GLUCOSE 132*  BUN 8  CREATININE 0.70  CALCIUM 9.9   GFR: Estimated Creatinine Clearance: 51 mL/min (by C-G formula based on SCr of 0.7 mg/dL). Liver Function Tests: No results for input(s): AST, ALT, ALKPHOS, BILITOT, PROT, ALBUMIN in the last 168 hours. No results for input(s): LIPASE, AMYLASE in the last 168 hours. No results for input(s): AMMONIA in the last 168 hours. Coagulation Profile: No results for input(s): INR, PROTIME in the last 168 hours. Cardiac Enzymes: No results  for input(s): CKTOTAL, CKMB, CKMBINDEX, TROPONINI in the last 168 hours. BNP (last 3 results) No results for input(s): PROBNP in the last 8760 hours. HbA1C: No results for input(s): HGBA1C in the last 72 hours. CBG: No results for input(s): GLUCAP in the last 168 hours. Lipid Profile: No results for input(s): CHOL, HDL, LDLCALC, TRIG, CHOLHDL, LDLDIRECT in the last 72 hours. Thyroid Function Tests: No results for input(s): TSH, T4TOTAL, FREET4, T3FREE, THYROIDAB in the last 72 hours. Anemia Panel: No results for input(s): VITAMINB12, FOLATE, FERRITIN, TIBC, IRON, RETICCTPCT in the last 72 hours. Urine analysis:    Component Value Date/Time   COLORURINE YELLOW 12/15/2015 1835   APPEARANCEUR CLEAR 12/15/2015 1835   LABSPEC 1.016 12/15/2015 1835   PHURINE 5.5 12/15/2015 1835   GLUCOSEU NEGATIVE 12/15/2015 1835   HGBUR SMALL (A) 12/15/2015 1835   BILIRUBINUR NEGATIVE 12/15/2015 1835   BILIRUBINUR negative 11/06/2010 1656   KETONESUR 15 (A) 12/15/2015 1835   PROTEINUR 30 (A) 12/15/2015 1835   UROBILINOGEN 0.2 11/06/2010 1656   UROBILINOGEN 0.2 09/08/2010 1618   NITRITE NEGATIVE 12/15/2015 1835   LEUKOCYTESUR NEGATIVE 12/15/2015 1835   Sepsis Labs: @LABRCNTIP (procalcitonin:4,lacticidven:4) )No results found for this or any previous visit (from the past 240 hour(s)).   Radiological Exams on Admission: No results found.  EKG: Independently reviewed.  Assessment/Plan Principal Problem:   Hyponatremia Active Problems:   Hypertension   Hypokalemia   History of alcohol dependence (HCC)    1. Hyponatremia - review of records demonstrates a nearly identical presentation (minus the HCTZ) back in 2012.  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  SIADH felt to be less likely. 1. Serum and urine osm, urine sodium 2. NS 3. Restrict fluid intake to 2L / day 4. Will also get CXR but patient is a never smoker 2. HTN  - Stop HCTZ, continue lisinopril and norvasc 3. H/o EtOH dependence - states last drink a month ago, and because she stopped drinking beer, she started her heavy tea drinking "to replace the fluid she wasn't getting from the alcohol", will put on CIWA without benzos for now, no evidence of withdrawal today.   DVT prophylaxis: Lovenox Code Status: Full Family Communication: Family at bedside Consults called: None Admission status: Admit to inpatient   Hillary Bow DO [facility] Pager [phone] from 7PM-7AM  If 7AM-7PM, please contact the day physician for the patient [email or web address] Password [email or web address]  12/15/2015, 8:55 PM

## 2015-12-15 NOTE — ED Notes (Signed)
Pt is awake and alert speaks only cambodian, daughter is with her at bedside she can understand some english.  Daughter brought patient in for evaluation of weakness and general malaise. Pt is currently receiving IV potassium, had a dose of PO potassium K+ was 2.9.  Pt's Vitals remain WNL.

## 2015-12-15 NOTE — ED Notes (Signed)
Spoke with charge nurse regarding room, they are now in the process of cleaning the room

## 2015-12-15 NOTE — ED Triage Notes (Signed)
Pt brought in by daughter in law with c/o weakness, dizziness, n/v all starting this morning. Pt denies fever at home. Pt started new BP yesterday. Pt reports increase dizziness going from lying to standing. Pt denies any pain.

## 2015-12-15 NOTE — ED Notes (Signed)
Orthostatic Vitals Lying BP 149/88 P 91 Sitting BP 161/98 P 92 Standing 151/99 P95

## 2015-12-15 NOTE — ED Notes (Signed)
Patient transported to CT 

## 2015-12-15 NOTE — ED Notes (Signed)
Attempted to give report RN unavailable at this time will call back

## 2015-12-15 NOTE — ED Notes (Signed)
Pt back from CT opt to be transported up to floor now

## 2015-12-15 NOTE — ED Provider Notes (Signed)
[facility]-EMERGENCY DEPT Provider Note   CSN: [id number] Arrival date & time: 12/15/15  1644     History   Chief Complaint Chief Complaint  Patient presents with  . Dizziness  . Weakness  . Nausea  . Emesis    HPI Molly Rodriguez is a 60 y.o. female.  She has a history of hypertension and hyperlipidemia. Today, she has felt weak with nausea. She has vomited several times. She states that her throat feels dry and she's been drinking a lot of water but the amount of water she is drinking is been limited by her nausea. She denies hurting anywhere. Her daughter tried to treat her with cleaning without relief. There is no known fever and no chills or sweats. Of note, she has had her antihypertensive medications changed. She was seen at a clinic 1 month ago and had lisinopril added to her HCTZ. She was seen 3 days ago and blood pressure was still elevated and she was given a lot of pain. She did call the clinic who suggested that she stop her blood pressure medications for 2 days. However, she was feeling worse and they came to the ED.   The history is provided by the patient.  Dizziness  Associated symptoms: vomiting and weakness   Weakness  Primary symptoms include dizziness. Associated symptoms include vomiting.  Emesis      Past Medical History:  Diagnosis Date  . Hypercholesteremia   . Hypertension     Patient Active Problem List   Diagnosis Date Noted  . Dysuria 11/12/2010  . Hypertension 11/06/2010    History reviewed. No pertinent surgical history.  OB History    No data available       Home Medications    Prior to Admission medications   Medication Sig Start Date End Date Taking? Authorizing Provider  amLODipine (NORVASC) 10 MG tablet Take 1 tablet (10 mg total) by mouth daily. 12/12/15   Henrietta Hoover, NP  lisinopril-hydrochlorothiazide (PRINZIDE,ZESTORETIC) 20-25 MG tablet Take 1 tablet by mouth daily. 12/12/15   Henrietta Hoover, NP    Family  History History reviewed. No pertinent family history.  Social History Social History  Substance Use Topics  . Smoking status: Never Smoker  . Smokeless tobacco: Never Used  . Alcohol use 1.2 oz/week    2 Cans of beer per week     Comment: daily     Allergies   Review of patient's allergies indicates no known allergies.   Review of Systems Review of Systems  Gastrointestinal: Positive for vomiting.  Neurological: Positive for dizziness and weakness.  All other systems reviewed and are negative.    Physical Exam Updated Vital Signs BP 170/99 (BP Location: Right Arm)   Pulse 98   Temp 98.2 F (36.8 C) (Oral)   Resp 18   Ht 4\' 11"  (1.499 m)   Wt 107 lb (48.5 kg)   SpO2 100%   BMI 21.61 kg/m   Physical Exam  Nursing note and vitals reviewed.  60 year old female, resting comfortably and in no acute distress. Vital signs are significant for hypertension. Oxygen saturation is 100%, which is normal. Head is normocephalic and atraumatic. PERRLA, EOMI. Oropharynx is clear. Neck is nontender and supple without adenopathy or JVD. Back is nontender and there is no CVA tenderness. Lungs are clear without rales, wheezes, or rhonchi. Chest is nontender. Heart has regular rate and rhythm without murmur. Abdomen is soft, flat, nontender without masses or hepatosplenomegaly and peristalsis is hypoactive.  Extremities have no cyanosis or edema, full range of motion is present. Skin is warm and dry without rash. Neurologic: Mental status is normal, cranial nerves are intact, there are no motor or sensory deficits.  ED Treatments / Results  Labs (all labs ordered are listed, but only abnormal results are displayed) Labs Reviewed  BASIC METABOLIC PANEL - Abnormal; Notable for the following:       Result Value   Sodium 122 (*)    Potassium 2.9 (*)    Chloride 85 (*)    Glucose, Bld 132 (*)    All other components within normal limits  CBC - Abnormal; Notable for the following:     WBC 11.2 (*)    RBC 5.72 (*)    MCV 73.4 (*)    MCH 25.9 (*)    All other components within normal limits  URINALYSIS, ROUTINE W REFLEX MICROSCOPIC (NOT AT [facility]) - Abnormal; Notable for the following:    Hgb urine dipstick SMALL (*)    Ketones, ur 15 (*)    Protein, ur 30 (*)    All other components within normal limits  URINE MICROSCOPIC-ADD ON - Abnormal; Notable for the following:    Squamous Epithelial / LPF 0-5 (*)    Bacteria, UA FEW (*)    Casts HYALINE CASTS (*)    All other components within normal limits  I-STAT TROPOININ, ED    EKG  EKG Interpretation  Date/Time:  Thursday December 15 2015 17:05:24 EDT Ventricular Rate:  107 PR Interval:  146 QRS Duration: 82 QT Interval:  366 QTC Calculation: 488 R Axis:   79 Text Interpretation:  Sinus tachycardia Possible Left atrial enlargement Nonspecific ST abnormality Abnormal ECG When compared with ECG of 09/08/2010, No significant change was found Confirmed by [facility]  MD, Kienan Doublin ([id number]) on 12/15/2015 7:02:56 PM       Procedures Procedures (including critical care time) CRITICAL CARE Performed by: XBJYN,WGNFAGLICK,Grae Cannata Total critical care time: 35 minutes Critical care time was exclusive of separately billable procedures and treating other patients. Critical care was necessary to treat or prevent imminent or life-threatening deterioration. Critical care was time spent personally by me on the following activities: development of treatment plan with patient and/or surrogate as well as nursing, discussions with consultants, evaluation of patient's response to treatment, examination of patient, obtaining history from patient or surrogate, ordering and performing treatments and interventions, ordering and review of laboratory studies, ordering and review of radiographic studies, pulse oximetry and re-evaluation of patient's condition.  Medications Ordered in ED Medications  sodium chloride 0.9 % bolus 1,000 mL (not administered)   potassium chloride 10 mEq in 100 mL IVPB (not administered)  potassium chloride SA (K-DUR,KLOR-CON) CR tablet 40 mEq (not administered)  ondansetron (ZOFRAN) injection 4 mg (not administered)     Initial Impression / Assessment and Plan / ED Course  I have reviewed the triage vital signs and the nursing notes.  Pertinent labs & imaging results that were available during my care of the patient were reviewed by me and considered in my medical decision making (see chart for details).  Clinical Course   Weakness and nausea. Labs have been drawn at triage and she is noted to have severe hyponatremia and hypokalemia. Orthostatic vital signs showed no significant changes. Electrolyte abnormality is probably due in large part to HCTZ. However, on review of old records, she had been admitted to the hospital in 2012 and also had significant hyponatremia and hypokalemia at a time she was  not on HCTZ. She was seen 1 month ago at which time she was on HCTZ and had lisinopril added. She was seen 3 days ago and had amlodipine added. Because of severity of hyponatremia which appears to be symptomatic, she will be admitted. She's given IV saline and she's given oral and intravenous potassium. Given her propensity to hyponatremia and hypokalemia, she should probably be taken off of her diuretics. Case is discussed with Dr. Julian Reil of [facility] who agrees to admit the patient.  Final Clinical Impressions(s) / ED Diagnoses   Final diagnoses:  Hyponatremia  Hypokalemia  Non-intractable vomiting with nausea, unspecified vomiting type  Essential hypertension    New Prescriptions New Prescriptions   No medications on file     Dione Booze, MD 12/15/15 2023

## 2015-12-16 ENCOUNTER — Encounter (HOSPITAL_COMMUNITY): Payer: Self-pay | Admitting: *Deleted

## 2015-12-16 DIAGNOSIS — E785 Hyperlipidemia, unspecified: Secondary | ICD-10-CM

## 2015-12-16 DIAGNOSIS — I10 Essential (primary) hypertension: Secondary | ICD-10-CM

## 2015-12-16 DIAGNOSIS — E871 Hypo-osmolality and hyponatremia: Principal | ICD-10-CM

## 2015-12-16 DIAGNOSIS — F1021 Alcohol dependence, in remission: Secondary | ICD-10-CM

## 2015-12-16 LAB — BASIC METABOLIC PANEL
ANION GAP: 7 (ref 5–15)
BUN: 5 mg/dL — ABNORMAL LOW (ref 6–20)
CALCIUM: 9.2 mg/dL (ref 8.9–10.3)
CO2: 23 mmol/L (ref 22–32)
CREATININE: 0.6 mg/dL (ref 0.44–1.00)
Chloride: 101 mmol/L (ref 101–111)
GLUCOSE: 118 mg/dL — AB (ref 65–99)
Potassium: 3.7 mmol/L (ref 3.5–5.1)
Sodium: 131 mmol/L — ABNORMAL LOW (ref 135–145)

## 2015-12-16 MED ORDER — ORAL CARE MOUTH RINSE
15.0000 mL | Freq: Two times a day (BID) | OROMUCOSAL | Status: DC
  Administered 2015-12-16: 15 mL via OROMUCOSAL

## 2015-12-16 MED ORDER — PNEUMOCOCCAL VAC POLYVALENT 25 MCG/0.5ML IJ INJ
0.5000 mL | INJECTION | INTRAMUSCULAR | Status: DC
  Filled 2015-12-16: qty 0.5

## 2015-12-16 NOTE — Progress Notes (Signed)
PROGRESS NOTE [facility]   Molly Rodriguez   ZOX:[id number] DOB: 20-Aug-1955  DOA: 12/15/2015 PCP: Concepcion Living, NP   Brief Narrative:  Molly Rodriguez is a 60 y.o. female with medical history significant of hypertension and hyperlipidemia. Today, she has felt weak with nausea. She has vomited several times. She states that her throat feels dry and she's been drinking a lot of water but the amount of water she is drinking is been limited by her nausea.  Of note per family she drinks up to 3.5L of water a day just between tea and bottled water.  She denies hurting anywhere.  There is no known fever and no chills or sweats. Of note, she has had her antihypertensive medications changed.  She was seen at a clinic 1 month ago and had lisinopril added to her HCTZ. She was seen 3 days ago and blood pressure was still elevated and she was started on amlodipine. She did call the clinic today who suggested that she stop her blood pressure medications for 2 days. However, she was feeling worse and they came to the Ed. Patient admitted for hyponatremia, started on IVF.   Subjective: Patient seen and examined this morning, report significantly improvement, dizziness on and off. No other complaints   Assessment & Plan:  Hyponatremia improving carefull with correction, - review of records demonstrates a nearly identical presentation (minus the HCTZ) back in 2012.  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 Patient reports today that she have a high water intake, SIADH felt to be less likely Serum and urine osm, urine sodium NS decrease rate to 75  Restrict fluid intake to 2L / day Hod HCTZ - could be the cause of hypoNa+  HTN - Stable  Hold HCTZ, continue lisinopril and norvasc  Hyperlipidemia  Continue home meds   DVT prophylaxis: Lovenox Code Status: Full Family Communication: Family at bedside Disposition: Anticipate d/c home  when Na+ level normalizes 24-48 hrs  Consultants:   None   Procedures  None   Antimicrobials:  None    Objective: Vitals:   12/16/15 0450 12/16/15 0606 12/16/15 1148 12/16/15 1200  BP: 124/76 117/75 125/72 125/72  Pulse: 78 73  76  Resp: 18 18    Temp: 98.4 F (36.9 C) 98.7 F (37.1 C)    TempSrc: Oral Oral    SpO2: 99% 98%    Weight:      Height:        Intake/Output Summary (Last 24 hours) at 12/16/15 1343 Last data filed at 12/16/15 1144  Gross per 24 hour  Intake          2317.92 ml  Output             4250 ml  Net         -1932.08 ml   Filed Weights   12/15/15 1710 12/15/15 2325  Weight: 48.5 kg (107 lb) 49.5 kg (109 lb 1.6 oz)    Examination:  General exam: Appears calm and comfortable  HEENT: AC/AT, PERRLA, OP moist and clear Respiratory system: Clear to auscultation. No wheezes,crackle or rhonchi Cardiovascular system: S1 & S2 heard, RRR. No JVD, murmurs, rubs or gallops Gastrointestinal system: Abdomen is nondistended, soft and nontender. No organomegaly or masses felt.  Central nervous system: Alert and oriented. No focal neurological deficits. Extremities: No pedal edema. Symmetric, strength 5/5   Skin: No rashes, lesions or ulcers Psychiatry: Judgement and insight appear normal. Mood &  affect appropriate.    Data Reviewed: I have personally reviewed following labs and imaging studies  CBC:  Recent Labs Lab 12/15/15 1722  WBC 11.2*  HGB 14.8  HCT 42.0  MCV 73.4*  PLT 307   Basic Metabolic Panel:  Recent Labs Lab 12/15/15 1722 12/16/15 0639  NA 122* 131*  K 2.9* 3.7  CL 85* 101  CO2 22 23  GLUCOSE 132* 118*  BUN 8 5*  CREATININE 0.70 0.60  CALCIUM 9.9 9.2   GFR: Estimated Creatinine Clearance: 53.7 mL/min (by C-G formula based on SCr of 0.6 mg/dL). Liver Function Tests: No results for input(s): AST, ALT, ALKPHOS, BILITOT, PROT, ALBUMIN in the last 168 hours. No results for input(s): LIPASE, AMYLASE in the last 168  hours. No results for input(s): AMMONIA in the last 168 hours. Coagulation Profile: No results for input(s): INR, PROTIME in the last 168 hours. Cardiac Enzymes: No results for input(s): CKTOTAL, CKMB, CKMBINDEX, TROPONINI in the last 168 hours. BNP (last 3 results) No results for input(s): PROBNP in the last 8760 hours. HbA1C: No results for input(s): HGBA1C in the last 72 hours. CBG: No results for input(s): GLUCAP in the last 168 hours. Lipid Profile: No results for input(s): CHOL, HDL, LDLCALC, TRIG, CHOLHDL, LDLDIRECT in the last 72 hours. Thyroid Function Tests: No results for input(s): TSH, T4TOTAL, FREET4, T3FREE, THYROIDAB in the last 72 hours. Anemia Panel: No results for input(s): VITAMINB12, FOLATE, FERRITIN, TIBC, IRON, RETICCTPCT in the last 72 hours. Sepsis Labs: No results for input(s): PROCALCITON, LATICACIDVEN in the last 168 hours.  No results found for this or any previous visit (from the past 240 hour(s)).       Radiology Studies: Dg Chest 2 View  Result Date: 12/15/2015 CLINICAL DATA:  Initial evaluation for acute shortness of breath. EXAM: CHEST  2 VIEW COMPARISON:  Prior radiograph from 09/08/2010. FINDINGS: The cardiac and mediastinal silhouettes are stable in size and contour, and remain within normal limits. The lungs are normally inflated. No airspace consolidation, pleural effusion, or pulmonary edema is identified. There is no pneumothorax. No acute osseous abnormality identified. IMPRESSION: No active cardiopulmonary disease. Electronically Signed   By: Rise MuBenjamin  McClintock M.D.   On: 12/15/2015 22:33      Scheduled Meds: . amLODipine  10 mg Oral Daily  . enoxaparin (LOVENOX) injection  40 mg Subcutaneous QHS  . folic acid  1 mg Oral Daily  . lisinopril  20 mg Oral Daily  . mouth rinse  15 mL Mouth Rinse BID  . multivitamin with minerals  1 tablet Oral Daily  . [START ON 12/17/2015] pneumococcal 23 valent vaccine  0.5 mL Intramuscular  Tomorrow-1000  . thiamine  100 mg Oral Daily   Or  . thiamine  100 mg Intravenous Daily   Continuous Infusions: . sodium chloride 75 mL/hr at 12/16/15 1158     LOS: 1 day    Latrelle DodrillEdwin Silva, MD [facility] Pager [phone]  If 7PM-7AM, please contact night-coverage [email or web address] Password [email or web address] 12/16/2015, 1:43 PM

## 2015-12-16 NOTE — Progress Notes (Signed)
Admitted  [place] pt.from ED,who speaks little English accompanied by son & daughter in law ,bec/of dizziness & vomiting & gen.weakness .With IVF NS infusing well on rt.FA ;site is clear ,no signs of infilt.noted.Made comfortable in bed .Placed pt.on heart monitor.& verified with central tele.Admission assessment completed with the help of the daughter in law.Skin is intact except bruises on back & chest area.Will continue to monitor pt.

## 2015-12-16 NOTE — Care Management Note (Signed)
Case Management Note  Patient Details  Name: Molly Rodriguez MRN: [id number] Date of Birth: 07/08/1955  Subjective/Objective:                 Independent patient who lives at home with son and daughter is from [place]. Quit drinking about a month ago and has replaced fluids with tea. Low K+. IVF, CIWA. PCP Bernhardt   Action/Plan:  Anticipate DC to home, self care.   Expected Discharge Date:                  Expected Discharge Plan:  Home/Self Care  In-House Referral:  NA  Discharge planning Services  CM Consult  Post Acute Care Choice:  NA Choice offered to:  NA  DME Arranged:  N/A DME Agency:  NA  HH Arranged:  NA HH Agency:  NA  Status of Service:  In process, will continue to follow  If discussed at Long Length of Stay Meetings, dates discussed:    Additional Comments:  Lawerance SabalDebbie Jansen Goodpasture, RN 12/16/2015, 2:39 PM

## 2015-12-17 LAB — BASIC METABOLIC PANEL
Anion gap: 9 (ref 5–15)
BUN: 16 mg/dL (ref 6–20)
CALCIUM: 8.6 mg/dL — AB (ref 8.9–10.3)
CO2: 20 mmol/L — AB (ref 22–32)
CREATININE: 0.65 mg/dL (ref 0.44–1.00)
Chloride: 112 mmol/L — ABNORMAL HIGH (ref 101–111)
GFR calc Af Amer: >60 mL/min (ref >60)
GLUCOSE: 93 mg/dL (ref 65–99)
Potassium: 3.6 mmol/L (ref 3.5–5.1)
Sodium: 141 mmol/L (ref 135–145)

## 2015-12-17 MED ORDER — LISINOPRIL 20 MG PO TABS: 20.0000 mg | ORAL_TABLET | Freq: Every day | ORAL | 0 refills | Status: DC

## 2015-12-17 NOTE — Discharge Summary (Signed)
Physician Discharge Summary  Molly Rodriguez  ZOX:[id number]  DOB: February 06, 1956  DOA: 12/15/2015 PCP: Concepcion Living, NP  Admit date: 12/15/2015 Discharge date: 12/17/2015  Admitted From: Home  Disposition:  Home   Recommendations for Outpatient Follow-up:  1. Follow up with PCP in 1-2 weeks 2. Please obtain BMP/CBC in one week  Home Health: None  Equipment/Devices: None   Discharge Condition: Stable  CODE STATUS: Full  Diet recommendation: Heart Healthy    Brief/Interim Summary: Molly Rodriguez a 60 y.o.femalewith medical history significant of hypertension and hyperlipidemia.She was brought in with weakness and nausea. She has vomited several times. She states that her throat feels dry and she's been drinking a lot of water but the amount of water she is drinking is been limited by her nausea. Of note per family she drinks up to 3.5L of water a day just between tea and bottled water.Also she has had her antihypertensive medications changed. She was seen at a clinic 1 month ago and had lisinopril added to her HCTZ.  Patient admitted for hyponatremia and hypokalemia started on IVF and K replacement. HCTZ was discontinued. Electrolytes normalized, BP was under control with Norvasc and Amlodipine. Patient back to baseline will be discharge home with close follow up as outpatient.  Subjective: Patient seen and examined at bedside. No acute events overnight. No complaints.   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 -  Resolved, -  Patient reports that she have a high water intake Restrict fluid intake to 2L / day Discontinue hydrochlorothiazide  Encouraged electrolyte infuse drinks like Gatorade, Pedialyte, etc Repeat BMP in 1 week   HTN - Stable  continue lisinopril and norvasc - BP under control while in the hospital  D/c HCTZ  Follow up with PMD in 1 week   Hyperlipidemia  Continue home meds  Discharge  Instructions  Discharge Instructions    Call MD for:  difficulty breathing, headache or visual disturbances    Complete by:  As directed    Call MD for:  extreme fatigue    Complete by:  As directed    Call MD for:  hives    Complete by:  As directed    Call MD for:  persistant dizziness or light-headedness    Complete by:  As directed    Call MD for:  persistant nausea and vomiting    Complete by:  As directed    Call MD for:  redness, tenderness, or signs of infection (pain, swelling, redness, odor or green/yellow discharge around incision site)    Complete by:  As directed    Call MD for:  severe uncontrolled pain    Complete by:  As directed    Call MD for:  temperature >100.4    Complete by:  As directed    Diet - low sodium heart healthy    Complete by:  As directed    Discharge instructions    Complete by:  As directed    Discharge instructions    Complete by:  As directed    Limit amount of water intake to 2L  Advised to drink, electrolytes enhanced water (Gatorade, Powerade, Pedialyte) Follow up with PMD  Stop taking Hydrochlorothiazide   Increase activity slowly    Complete by:  As directed        Medication List    TAKE these medications   amLODipine 10 MG tablet Commonly known as:  NORVASC Take 1 tablet (10 mg total)  by mouth daily.   lisinopril 20 MG tablet Commonly known as:  PRINIVIL,ZESTRIL Take 1 tablet (20 mg total) by mouth daily.       No Known Allergies  Consultations:  None    Procedures/Studies: Dg Chest 2 View  Result Date: 12/15/2015 CLINICAL DATA:  Initial evaluation for acute shortness of breath. EXAM: CHEST  2 VIEW COMPARISON:  Prior radiograph from 09/08/2010. FINDINGS: The cardiac and mediastinal silhouettes are stable in size and contour, and remain within normal limits. The lungs are normally inflated. No airspace consolidation, pleural effusion, or pulmonary edema is identified. There is no pneumothorax. No acute osseous  abnormality identified. IMPRESSION: No active cardiopulmonary disease. Electronically Signed   By: Rise Mu M.D.   On: 12/15/2015 22:33     Discharge Exam: Vitals:   12/17/15 0644 12/17/15 1054  BP: 119/64 130/82  Pulse: 64   Resp: 16   Temp: 98.6 F (37 C)    Vitals:   12/16/15 1527 12/16/15 2247 12/17/15 0644 12/17/15 1054  BP: 114/65 122/71 119/64 130/82  Pulse: 81 68 64   Resp: 18 17 16    Temp: 97.9 F (36.6 C) 98.6 F (37 C) 98.6 F (37 C)   TempSrc: Oral Oral Oral   SpO2: 99% 100% 100%   Weight:      Height:        General: Pt is alert, awake, not in acute distress Cardiovascular: RRR, S1/S2 +, no rubs, no gallops Respiratory: CTA bilaterally, no wheezing, no rhonchi Abdominal: Soft, NT, ND, bowel sounds + Extremities: no edema, no cyanosis    The results of significant diagnostics from this hospitalization (including imaging, microbiology, ancillary and laboratory) are listed below for reference.     Microbiology: No results found for this or any previous visit (from the past 240 hour(s)).   Labs: BNP (last 3 results) No results for input(s): BNP in the last 8760 hours. Basic Metabolic Panel:  Recent Labs Lab 12/15/15 1722 12/16/15 0639 12/17/15 0559  NA 122* 131* 141  K 2.9* 3.7 3.6  CL 85* 101 112*  CO2 22 23 20*  GLUCOSE 132* 118* 93  BUN 8 5* 16  CREATININE 0.70 0.60 0.65  CALCIUM 9.9 9.2 8.6*   Liver Function Tests: No results for input(s): AST, ALT, ALKPHOS, BILITOT, PROT, ALBUMIN in the last 168 hours. No results for input(s): LIPASE, AMYLASE in the last 168 hours. No results for input(s): AMMONIA in the last 168 hours. CBC:  Recent Labs Lab 12/15/15 1722  WBC 11.2*  HGB 14.8  HCT 42.0  MCV 73.4*  PLT 307   Cardiac Enzymes: No results for input(s): CKTOTAL, CKMB, CKMBINDEX, TROPONINI in the last 168 hours. BNP: Invalid input(s): POCBNP CBG: No results for input(s): GLUCAP in the last 168 hours. D-Dimer No  results for input(s): DDIMER in the last 72 hours. Hgb A1c No results for input(s): HGBA1C in the last 72 hours. Lipid Profile No results for input(s): CHOL, HDL, LDLCALC, TRIG, CHOLHDL, LDLDIRECT in the last 72 hours. Thyroid function studies No results for input(s): TSH, T4TOTAL, T3FREE, THYROIDAB in the last 72 hours.  Invalid input(s): FREET3 Anemia work up No results for input(s): VITAMINB12, FOLATE, FERRITIN, TIBC, IRON, RETICCTPCT in the last 72 hours. Urinalysis    Component Value Date/Time   COLORURINE YELLOW 12/15/2015 1835   APPEARANCEUR CLEAR 12/15/2015 1835   LABSPEC 1.016 12/15/2015 1835   PHURINE 5.5 12/15/2015 1835   GLUCOSEU NEGATIVE 12/15/2015 1835   HGBUR SMALL (A) 12/15/2015 1835  BILIRUBINUR NEGATIVE 12/15/2015 1835   BILIRUBINUR negative 11/06/2010 1656   KETONESUR 15 (A) 12/15/2015 1835   PROTEINUR 30 (A) 12/15/2015 1835   UROBILINOGEN 0.2 11/06/2010 1656   UROBILINOGEN 0.2 09/08/2010 1618   NITRITE NEGATIVE 12/15/2015 1835   LEUKOCYTESUR NEGATIVE 12/15/2015 1835   Sepsis Labs Invalid input(s): PROCALCITONIN,  WBC,  LACTICIDVEN Microbiology No results found for this or any previous visit (from the past 240 hour(s)).   Time coordinating discharge: Over 30 minutes  SIGNED:  Latrelle DodrillEdwin Silva, MD  [facility] 12/17/2015, 12:15 PM Pager   If 7PM-7AM, please contact night-coverage [email or web address] Password [email or web address]

## 2015-12-26 ENCOUNTER — Ambulatory Visit: Payer: Self-pay

## 2015-12-26 ENCOUNTER — Other Ambulatory Visit: Payer: Self-pay | Admitting: Family Medicine

## 2015-12-26 VITALS — BP 165/92 | HR 108

## 2015-12-26 DIAGNOSIS — I159 Secondary hypertension, unspecified: Secondary | ICD-10-CM

## 2015-12-26 MED ORDER — METOPROLOL SUCCINATE ER 25 MG PO TB24: 25.0000 mg | ORAL_TABLET | Freq: Every day | ORAL | 5 refills | Status: DC

## 2015-12-27 ENCOUNTER — Telehealth: Payer: Self-pay | Admitting: *Deleted

## 2015-12-27 NOTE — Telephone Encounter (Signed)
Patient verified DOB MA spoke with patients daughter regarding a concern with a BP medication which was added during yesterdays visit. Patients daughter was advised to have the patient stop the medication for 2 days and resume on Friday. If symptom is present on Friday call the office to be advised further. Daughter expressed understanding and had no further questions at this time.

## 2016-01-26 ENCOUNTER — Other Ambulatory Visit: Payer: Self-pay | Admitting: Family Medicine

## 2016-01-26 ENCOUNTER — Ambulatory Visit (INDEPENDENT_AMBULATORY_CARE_PROVIDER_SITE_OTHER): Payer: Self-pay

## 2016-01-26 VITALS — BP 144/77

## 2016-01-26 DIAGNOSIS — Z0130 Encounter for examination of blood pressure without abnormal findings: Secondary | ICD-10-CM

## 2016-01-26 MED ORDER — LISINOPRIL 30 MG PO TABS: 30.0000 mg | ORAL_TABLET | Freq: Every day | ORAL | 1 refills | Status: DC

## 2016-04-02 ENCOUNTER — Encounter: Payer: Self-pay | Admitting: Family Medicine

## 2016-04-02 ENCOUNTER — Ambulatory Visit (INDEPENDENT_AMBULATORY_CARE_PROVIDER_SITE_OTHER): Payer: Self-pay | Admitting: Family Medicine

## 2016-04-02 VITALS — BP 142/85 | HR 98 | Temp 98.0°F | Resp 18 | Ht 60.0 in | Wt 102.4 lb

## 2016-04-02 DIAGNOSIS — I10 Essential (primary) hypertension: Secondary | ICD-10-CM

## 2016-04-02 LAB — COMPLETE METABOLIC PANEL WITH GFR
ALT: 15 U/L (ref 6–29)
AST: 19 U/L (ref 10–35)
Albumin: 5 g/dL (ref 3.6–5.1)
Alkaline Phosphatase: 55 U/L (ref 33–130)
BILIRUBIN TOTAL: 0.6 mg/dL (ref 0.2–1.2)
BUN: 16 mg/dL (ref 7–25)
CALCIUM: 9.8 mg/dL (ref 8.6–10.4)
CHLORIDE: 104 mmol/L (ref 98–110)
CO2: 25 mmol/L (ref 20–31)
CREATININE: 0.77 mg/dL (ref 0.50–0.99)
GFR, Est African American: >89 mL/min (ref >=60)
GFR, Est Non African American: 84 mL/min (ref >=60)
Glucose, Bld: 123 mg/dL — ABNORMAL HIGH (ref 65–99)
Potassium: 3.3 mmol/L — ABNORMAL LOW (ref 3.5–5.3)
Sodium: 140 mmol/L (ref 135–146)
TOTAL PROTEIN: 7.9 g/dL (ref 6.1–8.1)

## 2016-04-02 LAB — CBC WITH DIFFERENTIAL/PLATELET
Basophils Absolute: 83 cells/uL (ref 0–200)
Basophils Relative: 1 %
EOS ABS: 83 {cells}/uL (ref 15–500)
Eosinophils Relative: 1 %
HEMATOCRIT: 38.9 % (ref 35.0–45.0)
Hemoglobin: 12.7 g/dL (ref 11.7–15.5)
LYMPHS PCT: 39 %
Lymphs Abs: 3237 cells/uL (ref 850–3900)
MCH: 25.3 pg — ABNORMAL LOW (ref 27.0–33.0)
MCHC: 32.6 g/dL (ref 32.0–36.0)
MCV: 77.5 fL — AB (ref 80.0–100.0)
MONO ABS: 415 {cells}/uL (ref 200–950)
MPV: 10.1 fL (ref 7.5–12.5)
Monocytes Relative: 5 %
Neutro Abs: 4482 cells/uL (ref 1500–7800)
Neutrophils Relative %: 54 %
Platelets: 339 10*3/uL (ref 140–400)
RBC: 5.02 MIL/uL (ref 3.80–5.10)
RDW: 15.9 % — AB (ref 11.0–15.0)
WBC: 8.3 10*3/uL (ref 3.8–10.8)

## 2016-04-02 LAB — LIPID PANEL
Cholesterol: 193 mg/dL (ref <200)
HDL: 80 mg/dL (ref >50)
LDL CALC: 83 mg/dL (ref <100)
Total CHOL/HDL Ratio: 2.4 Ratio (ref <5.0)
Triglycerides: 148 mg/dL (ref <150)
VLDL: 30 mg/dL (ref <30)

## 2016-04-02 LAB — TSH: TSH: 0.97 m[IU]/L

## 2016-04-04 ENCOUNTER — Telehealth: Payer: Self-pay

## 2016-04-04 NOTE — Telephone Encounter (Signed)
Called, no answer. Left message for patient to call back. Thanks!  

## 2016-04-04 NOTE — Telephone Encounter (Signed)
-----   Message from Henrietta HooverLinda C Bernhardt, NP sent at 04/03/2016  4:00 PM EST ----- Potassium a little low. Recommend increasing potassium rich foods.

## 2016-04-05 NOTE — Telephone Encounter (Signed)
Called, left message advising patient that potassium was low and to increase potassium rich foods in diet, such as bananas. Thanks!

## 2016-04-09 NOTE — Progress Notes (Signed)
Molly SlickKhen Giammarco, is a 61 y.o. female  ZOX:[id number]  [id number]  DOB - 06/03/1955  CC:  Chief Complaint  Patient presents with  . Follow-up  . Hypertension       HPI: Molly Rodriguez is a 61 y.o. female here for follow-up hypertension.  He denies any specific complaints today. He is not following a low salt diet and is not exercising regularly.    No Known Allergies Past Medical History:  Diagnosis Date  . Hypercholesteremia   . Hypertension    Current Outpatient Prescriptions on File Prior to Visit  Medication Sig Dispense Refill  . amLODipine (NORVASC) 10 MG tablet Take 1 tablet (10 mg total) by mouth daily. 90 tablet 1  . lisinopril (PRINIVIL,ZESTRIL) 30 MG tablet Take 1 tablet (30 mg total) by mouth daily. 90 tablet 1  . metoprolol succinate (TOPROL XL) 25 MG 24 hr tablet Take 1 tablet (25 mg total) by mouth daily. 30 tablet 5   No current facility-administered medications on file prior to visit.    No family history on file. Social History   Social History  . Marital status: Single    Spouse name: N/A  . Number of children: N/A  . Years of education: N/A   Occupational History  . Not on file.   Social History Main Topics  . Smoking status: Never Smoker  . Smokeless tobacco: Never Used  . Alcohol use 1.2 oz/week    2 Cans of beer per week     Comment: daily  . Drug use: No  . Sexual activity: Yes    Birth control/ protection: Post-menopausal   Other Topics Concern  . Not on file   Social History Narrative  . No narrative on file    Review of Systems: Constitutional: Negative Skin: Negative HENT: Negative  Eyes: Negative  Neck: Negative Respiratory: Negative Cardiovascular: Negative Gastrointestinal: Negative Genitourinary: Negative  Musculoskeletal: Negative   Neurological: Negative for Hematological: Negative  Psychiatric/Behavioral: Negative    Objective:   Vitals:   04/02/16 1536 04/02/16 1557  BP: (!) 151/80 (!) 142/85  Pulse: 100  98  Resp: 18   Temp: 98 F (36.7 C)     Physical Exam: Constitutional: Patient appears well-developed and well-nourished. No distress. HENT: Normocephalic, atraumatic, External right and left ear normal. Oropharynx is clear and moist.  Eyes: Conjunctivae and EOM are normal. PERRLA, no scleral icterus. Neck: Normal ROM. Neck supple. No lymphadenopathy, No thyromegaly. CVS: RRR, S1/S2 +, no murmurs, no gallops, no rubs Pulmonary: Effort and breath sounds normal, no stridor, rhonchi, wheezes, rales.  Abdominal: Soft. Normoactive BS,, no distension, tenderness, rebound or guarding.  Musculoskeletal: Normal range of motion. No edema and no tenderness.  Neuro: Alert.Normal muscle tone coordination. Non-focal Skin: Skin is warm and dry. No rash noted. Not diaphoretic. No erythema. No pallor. Psychiatric: Normal mood and affect. Behavior, judgment, thought content normal.  Lab Results  Component Value Date   WBC 8.3 04/02/2016   HGB 12.7 04/02/2016   HCT 38.9 04/02/2016   MCV 77.5 (L) 04/02/2016   PLT 339 04/02/2016   Lab Results  Component Value Date   CREATININE 0.77 04/02/2016   BUN 16 04/02/2016   NA 140 04/02/2016   K 3.3 (L) 04/02/2016   CL 104 04/02/2016   CO2 25 04/02/2016    Lab Results  Component Value Date   HGBA1C 5.6 11/14/2015   Lipid Panel     Component Value Date/Time   CHOL 193 04/02/2016 1610  TRIG 148 04/02/2016 1610   HDL 80 04/02/2016 1610   CHOLHDL 2.4 04/02/2016 1610   VLDL 30 04/02/2016 1610   LDLCALC 83 04/02/2016 1610        Assessment and plan:   1. Essential hypertension  - COMPLETE METABOLIC PANEL WITH GFR - CBC with Differential - Lipid panel - TSH   Follow-up in 3 months.   The patient was given clear instructions to go to ER or return to medical center if symptoms don't improve, worsen or new problems develop. The patient verbalized understanding.    Henrietta Hoover FNP  04/09/2016, 8:54 AM

## 2016-07-09 ENCOUNTER — Ambulatory Visit (INDEPENDENT_AMBULATORY_CARE_PROVIDER_SITE_OTHER): Payer: Self-pay | Admitting: Family Medicine

## 2016-07-09 ENCOUNTER — Encounter: Payer: Self-pay | Admitting: Family Medicine

## 2016-07-09 VITALS — BP 144/84 | HR 121 | Temp 97.9°F | Resp 14 | Ht 60.0 in | Wt 106.0 lb

## 2016-07-09 DIAGNOSIS — R9431 Abnormal electrocardiogram [ECG] [EKG]: Secondary | ICD-10-CM

## 2016-07-09 DIAGNOSIS — Z1231 Encounter for screening mammogram for malignant neoplasm of breast: Secondary | ICD-10-CM

## 2016-07-09 DIAGNOSIS — Z131 Encounter for screening for diabetes mellitus: Secondary | ICD-10-CM

## 2016-07-09 DIAGNOSIS — Z1239 Encounter for other screening for malignant neoplasm of breast: Secondary | ICD-10-CM

## 2016-07-09 DIAGNOSIS — I10 Essential (primary) hypertension: Secondary | ICD-10-CM

## 2016-07-09 DIAGNOSIS — R000 Tachycardia, unspecified: Secondary | ICD-10-CM

## 2016-07-09 DIAGNOSIS — Z13220 Encounter for screening for lipoid disorders: Secondary | ICD-10-CM

## 2016-07-09 LAB — POCT URINALYSIS DIP (DEVICE)
Bilirubin Urine: NEGATIVE
GLUCOSE, UA: NEGATIVE mg/dL
Ketones, ur: NEGATIVE mg/dL
Leukocytes, UA: NEGATIVE
Nitrite: NEGATIVE
PROTEIN: NEGATIVE mg/dL
Specific Gravity, Urine: 1.025 (ref 1.005–1.030)
UROBILINOGEN UA: 0.2 mg/dL (ref 0.0–1.0)
pH: 6.5 (ref 5.0–8.0)

## 2016-07-09 LAB — CBC WITH DIFFERENTIAL/PLATELET
BASOS ABS: 66 {cells}/uL (ref 0–200)
BASOS PCT: 1 %
EOS ABS: 66 {cells}/uL (ref 15–500)
Eosinophils Relative: 1 %
HEMATOCRIT: 37.7 % (ref 35.0–45.0)
Hemoglobin: 12.1 g/dL (ref 11.7–15.5)
LYMPHS PCT: 33 %
Lymphs Abs: 2178 cells/uL (ref 850–3900)
MCH: 25.7 pg — ABNORMAL LOW (ref 27.0–33.0)
MCHC: 32.1 g/dL (ref 32.0–36.0)
MCV: 80.2 fL (ref 80.0–100.0)
MONO ABS: 330 {cells}/uL (ref 200–950)
MPV: 9.9 fL (ref 7.5–12.5)
Monocytes Relative: 5 %
Neutro Abs: 3960 cells/uL (ref 1500–7800)
Neutrophils Relative %: 60 %
Platelets: 256 10*3/uL (ref 140–400)
RBC: 4.7 MIL/uL (ref 3.80–5.10)
RDW: 15.2 % — AB (ref 11.0–15.0)
WBC: 6.6 10*3/uL (ref 3.8–10.8)

## 2016-07-09 LAB — POCT GLYCOSYLATED HEMOGLOBIN (HGB A1C): HEMOGLOBIN A1C: 5.7

## 2016-07-09 NOTE — Progress Notes (Signed)
Patient ID: Molly Rodriguez, female    DOB: 1955/05/29, 61 y.o.   MRN: [id number]  PCP: Bing Neighbors, FNP  Chief Complaint  Patient presents with  . Follow-up    3 MONTH ON BLOOD PRESSURE    Subjective:  HPI  Molly Rodriguez is a 61 y.o. female presents for evaluation of hypertension.  Medical history includes: Uncontrolled Hypertension and Sinus Tachycardia.  Molly Rodriguez is accompanied today by her daughter who is assisting with interpreting during visit today.  Patient states "I feel fine". She denies any recent chest pain, shortness of breath, palpitations, dizziness, or headaches. No routine monitoring of blood pressure at home. Currently prescribed Amlodipine, Lisinopril and Metoprolol. Takes medication as prescribed. BP and heart rate both significantly elevated today. Patient attributes both to nervous about clinic visit today. Take metoprolol for rate control as she has a history of Sinus Tachycardia. She has had abnormal EKG's and there's no record of formal evaluation by cardiology.  Social History   Social History  . Marital status: Single    Spouse name: N/A  . Number of children: N/A  . Years of education: N/A   Occupational History  . Not on file.   Social History Main Topics  . Smoking status: Never Smoker  . Smokeless tobacco: Never Used  . Alcohol use 1.2 oz/week    2 Cans of beer per week     Comment: daily  . Drug use: No  . Sexual activity: Yes    Birth control/ protection: Post-menopausal   Other Topics Concern  . Not on file   Social History Narrative  . No narrative on file   History reviewed. No pertinent family history. Review of Systems  See HPi  Patient Active Problem List   Diagnosis Date Noted  . Hyponatremia 12/15/2015  . Hypokalemia 12/15/2015  . History of alcohol dependence (HCC) 12/15/2015  . Dysuria 11/12/2010  . Hypertension 11/06/2010    No Known Allergies  Prior to Admission medications   Medication Sig Start Date End  Date Taking? Authorizing Provider  amLODipine (NORVASC) 10 MG tablet Take 1 tablet (10 mg total) by mouth daily. 12/12/15  Yes Henrietta Hoover, NP  lisinopril (PRINIVIL,ZESTRIL) 30 MG tablet Take 1 tablet (30 mg total) by mouth daily. 01/26/16  Yes Henrietta Hoover, NP  metoprolol succinate (TOPROL XL) 25 MG 24 hr tablet Take 1 tablet (25 mg total) by mouth daily. 12/26/15  Yes Henrietta Hoover, NP    Past Medical, Surgical Family and Social History reviewed and updated.    Objective:   Today's Vitals   07/09/16 1533 07/09/16 1536  BP: (!) 157/86 (!) 144/84  Pulse: (!) 121   Resp: 14   Temp: 97.9 F (36.6 C)   TempSrc: Oral   SpO2: 100%   Weight: 106 lb (48.1 kg)   Height: 5' (1.524 m)     Wt Readings from Last 3 Encounters:  07/09/16 106 lb (48.1 kg)  04/02/16 102 lb 6.4 oz (46.4 kg)  12/15/15 109 lb 1.6 oz (49.5 kg)   Physical Exam  Constitutional: She is oriented to person, place, and time. She appears well-developed and well-nourished.  HENT:  Head: Normocephalic and atraumatic.  Eyes: Conjunctivae and EOM are normal. Pupils are equal, round, and reactive to light.  Neck: Normal range of motion. Neck supple. No JVD present. No thyromegaly present.  Cardiovascular: Normal heart sounds and intact distal pulses.  A regularly irregular rhythm present.  Pulses:  Carotid pulses are 2+ on the right side, and 2+ on the left side.      Radial pulses are 2+ on the right side, and 2+ on the left side.  Rate on exam 88 bpm, w/ slight irregular rhythm  Negative murmur    Pulmonary/Chest: Effort normal and breath sounds normal.  Musculoskeletal: Normal range of motion.  Neurological: She is alert and oriented to person, place, and time.  Skin: Skin is warm and dry.  Psychiatric: She has a normal mood and affect. Her behavior is normal. Judgment and thought content normal.   Assessment & Plan:  1. EKG, abnormal - Magnesium - Ambulatory referral to Cardiology - EKG  12-Lead  2. Essential hypertension - COMPLETE METABOLIC PANEL WITH GFR - 3. Screening, lipid - Lipid panel  4. Tachycardia - CBC with Differential/Platelet - Thyroid Panel With TSH  -Continue Metoprolol. Increased from 25 mg to 50 mg daily.  5. Screening for diabetes mellitus - POCT glycosylated hemoglobin (Hb A1C)-5.7 (prediabetes)  6. Screening breast examination - MM Digital Screening  RTC: 6 weeks for hypertension and health maintenance follow-up  Godfrey PickKimberly S. Tiburcio PeaHarris, MSN, FNP-C [facility]  [address] [place], [place] [address] [phone]

## 2016-07-09 NOTE — Patient Instructions (Signed)
I am increasing your metoprolol from 25 mg to 50 mg for heart rate control.  I have referred you to cardiology for further evaluation of accelerated heart rate   I would like to see you back in 6 weeks for routine health maintenance and to evaluate your blood pressure and heart rate.    DASH Eating Plan DASH stands for "Dietary Approaches to Stop Hypertension." The DASH eating plan is a healthy eating plan that has been shown to reduce high blood pressure (hypertension). It may also reduce your risk for type 2 diabetes, heart disease, and stroke. The DASH eating plan may also help with weight loss. What are tips for following this plan? General guidelines   Avoid eating more than 2,300 mg (milligrams) of salt (sodium) a day. If you have hypertension, you may need to reduce your sodium intake to 1,500 mg a day.  Limit alcohol intake to no more than 1 drink a day for nonpregnant women and 2 drinks a day for men. One drink equals 12 oz of beer, 5 oz of wine, or 1 oz of hard liquor.  Work with your health care provider to maintain a healthy body weight or to lose weight. Ask what an ideal weight is for you.  Get at least 30 minutes of exercise that causes your heart to beat faster (aerobic exercise) most days of the week. Activities may include walking, swimming, or biking.  Work with your health care provider or diet and nutrition specialist (dietitian) to adjust your eating plan to your individual calorie needs. Reading food labels   Check food labels for the amount of sodium per serving. Choose foods with less than 5 percent of the Daily Value of sodium. Generally, foods with less than 300 mg of sodium per serving fit into this eating plan.  To find whole grains, look for the word "whole" as the first word in the ingredient list. Shopping   Buy products labeled as "low-sodium" or "no salt added."  Buy fresh foods. Avoid canned foods and premade or frozen meals. Cooking   Avoid  adding salt when cooking. Use salt-free seasonings or herbs instead of table salt or sea salt. Check with your health care provider or pharmacist before using salt substitutes.  Do not fry foods. Cook foods using healthy methods such as baking, boiling, grilling, and broiling instead.  Cook with heart-healthy oils, such as olive, canola, soybean, or sunflower oil. Meal planning    Eat a balanced diet that includes:  5 or more servings of fruits and vegetables each day. At each meal, try to fill half of your plate with fruits and vegetables.  Up to 6-8 servings of whole grains each day.  Less than 6 oz of lean meat, poultry, or fish each day. A 3-oz serving of meat is about the same size as a deck of cards. One egg equals 1 oz.  2 servings of low-fat dairy each day.  A serving of nuts, seeds, or beans 5 times each week.  Heart-healthy fats. Healthy fats called Omega-3 fatty acids are found in foods such as flaxseeds and coldwater fish, like sardines, salmon, and mackerel.  Limit how much you eat of the following:  Canned or prepackaged foods.  Food that is high in trans fat, such as fried foods.  Food that is high in saturated fat, such as fatty meat.  Sweets, desserts, sugary drinks, and other foods with added sugar.  Full-fat dairy products.  Do not salt foods before eating.  Try to eat at least 2 vegetarian meals each week.  Eat more home-cooked food and less restaurant, buffet, and fast food.  When eating at a restaurant, ask that your food be prepared with less salt or no salt, if possible. What foods are recommended? The items listed may not be a complete list. Talk with your dietitian about what dietary choices are best for you. Grains  Whole-grain or whole-wheat bread. Whole-grain or whole-wheat pasta. Brown rice. [place]ena Morrow. Bulgur. Whole-grain and low-sodium cereals. Pita bread. Low-fat, low-sodium crackers. Whole-wheat flour tortillas. Vegetables  Fresh or  frozen vegetables (raw, steamed, roasted, or grilled). Low-sodium or reduced-sodium tomato and vegetable juice. Low-sodium or reduced-sodium tomato sauce and tomato paste. Low-sodium or reduced-sodium canned vegetables. Fruits  All fresh, dried, or frozen fruit. Canned fruit in natural juice (without added sugar). Meat and other protein foods  Skinless chicken or [place]. Ground chicken or [place]. Pork with fat trimmed off. Fish and seafood. Egg whites. Dried beans, peas, or lentils. Unsalted nuts, nut butters, and seeds. Unsalted canned beans. Lean cuts of beef with fat trimmed off. Low-sodium, lean deli meat. Dairy  Low-fat (1%) or fat-free (skim) milk. Fat-free, low-fat, or reduced-fat cheeses. Nonfat, low-sodium ricotta or cottage cheese. Low-fat or nonfat yogurt. Low-fat, low-sodium cheese. Fats and oils  Soft margarine without trans fats. Vegetable oil. Low-fat, reduced-fat, or light mayonnaise and salad dressings (reduced-sodium). Canola, safflower, olive, soybean, and sunflower oils. Avocado. Seasoning and other foods  Herbs. Spices. Seasoning mixes without salt. Unsalted popcorn and pretzels. Fat-free sweets. What foods are not recommended? The items listed may not be a complete list. Talk with your dietitian about what dietary choices are best for you. Grains  Baked goods made with fat, such as croissants, muffins, or some breads. Dry pasta or rice meal packs. Vegetables  Creamed or fried vegetables. Vegetables in a cheese sauce. Regular canned vegetables (not low-sodium or reduced-sodium). Regular canned tomato sauce and paste (not low-sodium or reduced-sodium). Regular tomato and vegetable juice (not low-sodium or reduced-sodium). Angie Fava. Olives. Fruits  Canned fruit in a light or heavy syrup. Fried fruit. Fruit in cream or butter sauce. Meat and other protein foods  Fatty cuts of meat. Ribs. Fried meat. Berniece Salines. Sausage. Bologna and other processed lunch meats. Salami. Fatback.  Hotdogs. Bratwurst. Salted nuts and seeds. Canned beans with added salt. Canned or smoked fish. Whole eggs or egg yolks. Chicken or [place] with skin. Dairy  Whole or 2% milk, cream, and half-and-half. Whole or full-fat cream cheese. Whole-fat or sweetened yogurt. Full-fat cheese. Nondairy creamers. Whipped toppings. Processed cheese and cheese spreads. Fats and oils  Butter. Stick margarine. Lard. Shortening. Ghee. Bacon fat. Tropical oils, such as coconut, palm kernel, or palm oil. Seasoning and other foods  Salted popcorn and pretzels. Onion salt, garlic salt, seasoned salt, table salt, and sea salt. Worcestershire sauce. Tartar sauce. Barbecue sauce. Teriyaki sauce. Soy sauce, including reduced-sodium. Steak sauce. Canned and packaged gravies. Fish sauce. Oyster sauce. Cocktail sauce. Horseradish that you find on the shelf. Ketchup. Mustard. Meat flavorings and tenderizers. Bouillon cubes. Hot sauce and Tabasco sauce. Premade or packaged marinades. Premade or packaged taco seasonings. Relishes. Regular salad dressings. Where to find more information:  National Heart, Lung, and [place]: https://wilson-eaton.com/  American Heart Association: www.heart.org Summary  The DASH eating plan is a healthy eating plan that has been shown to reduce high blood pressure (hypertension). It may also reduce your risk for type 2 diabetes, heart disease, and stroke.  With the DASH  eating plan, you should limit salt (sodium) intake to 2,300 mg a day. If you have hypertension, you may need to reduce your sodium intake to 1,500 mg a day.  When on the DASH eating plan, aim to eat more fresh fruits and vegetables, whole grains, lean proteins, low-fat dairy, and heart-healthy fats.  Work with your health care provider or diet and nutrition specialist (dietitian) to adjust your eating plan to your individual calorie needs. This information is not intended to replace advice given to you by your health care provider.  Make sure you discuss any questions you have with your health care provider. Document Released: 02/01/2011 Document Revised: 02/06/2016 Document Reviewed: 02/06/2016 Elsevier Interactive Patient Education  2017 [employer].

## 2016-07-10 LAB — LIPID PANEL
CHOL/HDL RATIO: 2.1 ratio (ref <5.0)
CHOLESTEROL: 193 mg/dL (ref <200)
HDL: 93 mg/dL (ref >50)
LDL Cholesterol: 67 mg/dL (ref <100)
TRIGLYCERIDES: 166 mg/dL — AB (ref <150)
VLDL: 33 mg/dL — AB (ref <30)

## 2016-07-10 LAB — COMPLETE METABOLIC PANEL WITH GFR
ALT: 18 U/L (ref 6–29)
AST: 33 U/L (ref 10–35)
Albumin: 4.5 g/dL (ref 3.6–5.1)
Alkaline Phosphatase: 49 U/L (ref 33–130)
BUN: 13 mg/dL (ref 7–25)
CALCIUM: 9.2 mg/dL (ref 8.6–10.4)
CHLORIDE: 107 mmol/L (ref 98–110)
CO2: 23 mmol/L (ref 20–31)
Creat: 0.82 mg/dL (ref 0.50–0.99)
GFR, EST AFRICAN AMERICAN: 89 mL/min (ref >=60)
GFR, EST NON AFRICAN AMERICAN: 77 mL/min (ref >=60)
Glucose, Bld: 118 mg/dL — ABNORMAL HIGH (ref 65–99)
POTASSIUM: 3.2 mmol/L — AB (ref 3.5–5.3)
Sodium: 142 mmol/L (ref 135–146)
Total Bilirubin: 0.4 mg/dL (ref 0.2–1.2)
Total Protein: 7.4 g/dL (ref 6.1–8.1)

## 2016-07-10 LAB — THYROID PANEL WITH TSH
FREE THYROXINE INDEX: 2.2 (ref 1.4–3.8)
T3 Uptake: 33 % (ref 22–35)
T4, Total: 6.7 ug/dL (ref 4.5–12.0)
TSH: 1.27 m[IU]/L

## 2016-07-10 LAB — MAGNESIUM: Magnesium: 1.9 mg/dL (ref 1.5–2.5)

## 2016-07-10 MED ORDER — POTASSIUM CHLORIDE CRYS ER 20 MEQ PO TBCR: 20.0000 meq | EXTENDED_RELEASE_TABLET | Freq: Every day | ORAL | 0 refills | Status: DC

## 2016-07-12 MED ORDER — METOPROLOL SUCCINATE ER 25 MG PO TB24: 50.0000 mg | ORAL_TABLET | Freq: Every day | ORAL | 5 refills | Status: DC

## 2016-08-23 ENCOUNTER — Ambulatory Visit (INDEPENDENT_AMBULATORY_CARE_PROVIDER_SITE_OTHER): Payer: Self-pay | Admitting: Family Medicine

## 2016-08-23 ENCOUNTER — Encounter: Payer: Self-pay | Admitting: Family Medicine

## 2016-08-23 VITALS — BP 148/86 | HR 94 | Ht 60.0 in | Wt 106.2 lb

## 2016-08-23 DIAGNOSIS — Z1239 Encounter for other screening for malignant neoplasm of breast: Secondary | ICD-10-CM

## 2016-08-23 DIAGNOSIS — Z1231 Encounter for screening mammogram for malignant neoplasm of breast: Secondary | ICD-10-CM

## 2016-08-23 DIAGNOSIS — I10 Essential (primary) hypertension: Secondary | ICD-10-CM

## 2016-08-23 DIAGNOSIS — Z23 Encounter for immunization: Secondary | ICD-10-CM

## 2016-08-23 MED ORDER — LISINOPRIL 30 MG PO TABS: 30.0000 mg | ORAL_TABLET | Freq: Every day | ORAL | 2 refills | Status: DC

## 2016-08-23 MED ORDER — AMLODIPINE BESYLATE 10 MG PO TABS: 10.0000 mg | ORAL_TABLET | Freq: Every day | ORAL | 2 refills | Status: DC

## 2016-08-23 MED ORDER — METOPROLOL SUCCINATE ER 25 MG PO TB24: 50.0000 mg | ORAL_TABLET | Freq: Every day | ORAL | 3 refills | Status: DC

## 2016-08-23 NOTE — Progress Notes (Signed)
Patient ID: Molly Rodriguez Pollino, female    DOB: 12/04/1955, 61 y.o.   MRN: [id number]  PCP: Bing NeighborsHarris, Ranell Skibinski S, FNP  Chief Complaint  Patient presents with  . Follow-up     6 week    Subjective:  HPI Molly Rodriguez Aveni is a 61 y.o. female presents for hypertension follow-up.  Hypertension  Keturah Dennis Bastgeth was last seen in office on 07/09/2016. During that visit her blood pressure was mildly elevated however she was having significant sinus tachycardia. She was asymptomatic. EKG during that visit showed left atrial enlargement, and  ST abnormality which was unchanged from prior EKG however patient's daughter confirms that patient has not had any recent evaluation by a cardiologist. At last office visit her metoprolol was increased from 25 mg to 50 mg for control of heart rate in lowering her blood pressure. She denies any recent chest pain, dizziness, sensations of palpitations, or sensation that her heart is beating really fast. Cardiology did make attempts to schedule patient for evaluation however they were unable to reach anyone by contacting telephone number on file. Patient was given the [place] patient assistance application at last visit however has not completed paperwork.  Social History   Social History  . Marital status: Single    Spouse name: N/A  . Number of children: N/A  . Years of education: N/A   Occupational History  . Not on file.   Social History Main Topics  . Smoking status: Never Smoker  . Smokeless tobacco: Never Used  . Alcohol use 1.2 oz/week    2 Cans of beer per week     Comment: daily  . Drug use: No  . Sexual activity: Yes    Birth control/ protection: Post-menopausal   Other Topics Concern  . Not on file   Social History Narrative  . No narrative on file   No family history on file.  Review of Systems See HPI   Patient Active Problem List   Diagnosis Date Noted  . Hyponatremia 12/15/2015  . Hypokalemia 12/15/2015  . History of alcohol dependence (HCC)  12/15/2015  . Dysuria 11/12/2010  . Hypertension 11/06/2010    No Known Allergies  Prior to Admission medications   Medication Sig Start Date End Date Taking? Authorizing Provider  amLODipine (NORVASC) 10 MG tablet Take 1 tablet (10 mg total) by mouth daily. 12/12/15  Yes Henrietta HooverBernhardt, Linda C, NP  lisinopril (PRINIVIL,ZESTRIL) 30 MG tablet Take 1 tablet (30 mg total) by mouth daily. 01/26/16  Yes Henrietta HooverBernhardt, Linda C, NP  metoprolol succinate (TOPROL XL) 25 MG 24 hr tablet Take 2 tablets (50 mg total) by mouth daily. 07/12/16  Yes Bing NeighborsHarris, Raynaldo Falco S, FNP  potassium chloride SA (K-DUR,KLOR-CON) 20 MEQ tablet Take 1 tablet (20 mEq total) by mouth daily. 07/10/16 07/15/16  Bing NeighborsHarris, Kennith Morss S, FNP    Past Medical, Surgical Family and Social History reviewed and updated.    Objective:   Today's Vitals   08/23/16 1528  BP: (!) 148/86  Pulse: 94  SpO2: 100%  Weight: 106 lb 4 oz (48.2 kg)  Height: 5' (1.524 m)     Wt Readings from Last 3 Encounters:  08/23/16 106 lb 4 oz (48.2 kg)  07/09/16 106 lb (48.1 kg)  04/02/16 102 lb 6.4 oz (46.4 kg)   Physical Exam  Constitutional: She is oriented to person, place, and time. She appears well-developed and well-nourished.  HENT:  Head: Normocephalic and atraumatic.  Eyes: Conjunctivae and EOM are normal. Pupils are equal, round,  and reactive to light.  Neck: Normal range of motion. Neck supple.  Cardiovascular: Normal rate, regular rhythm, normal heart sounds and intact distal pulses.   Pulmonary/Chest: Effort normal and breath sounds normal.  Musculoskeletal: Normal range of motion.  Lymphadenopathy:    She has no cervical adenopathy.  Neurological: She is alert and oriented to person, place, and time.  Skin: Skin is warm and dry.  Psychiatric: She has a normal mood and affect. Her behavior is normal. Judgment and thought content normal.   Assessment & Plan:  1. Essential hypertension, Mildly elevated today -Continue metoprolol 50 mg  daily, amlodipine 10 mg daily. Heart rate is stable today.  2. Screening breast examination - MM Digital Screening;   Call cardiologist to reschedule visit. Complete [place] financial assistance form. Follow-up with cardiology to reschedule office visit. Pap rescheduled for next follow-up as you arrived late for your appointment today  RTC: PAP and Hypertension follow-up 3 months. Recheck A1C.  Godfrey Pick. Tiburcio Pea, MSN, FNP-C [facility]  [address] [place], [place] [address] [phone]

## 2016-08-23 NOTE — Patient Instructions (Addendum)
To schedule your breast exam, please call [facility] at Phone: [phone]  You will be contacted regarding scheduling of your cardiology appointment.   Please complete the [place] patient assistance form and mail to the address listed on paperwork. If you have any questions about the application process please call the number listed on the paperwork.

## 2016-08-24 ENCOUNTER — Telehealth: Payer: Self-pay

## 2016-08-24 NOTE — Telephone Encounter (Signed)
Spoke with [facility] and they reached out to patient 4 times and back didn't return call. I spoke with Patient daughter Tamera PuntMiranda and gave her the information to [facility] to schedule appointment.

## 2016-08-24 NOTE — Telephone Encounter (Signed)
-----   Message from Bing NeighborsKimberly S Harris, FNP sent at 08/23/2016  4:15 PM EDT ----- Please follow-up on scheduling patient for cardiology appointment

## 2016-12-24 ENCOUNTER — Other Ambulatory Visit (HOSPITAL_COMMUNITY)
Admission: RE | Admit: 2016-12-24 | Discharge: 2016-12-24 | Disposition: A | Discharge status: Home / Self Care | Payer: Self-pay | Source: Ambulatory Visit | Attending: Family Medicine | Admitting: Family Medicine

## 2016-12-24 ENCOUNTER — Ambulatory Visit (INDEPENDENT_AMBULATORY_CARE_PROVIDER_SITE_OTHER): Payer: Self-pay | Admitting: Family Medicine

## 2016-12-24 ENCOUNTER — Encounter: Payer: Self-pay | Admitting: Family Medicine

## 2016-12-24 VITALS — BP 140/78 | HR 74 | Temp 98.7°F | Resp 14 | Ht 60.0 in | Wt 107.0 lb

## 2016-12-24 DIAGNOSIS — Z131 Encounter for screening for diabetes mellitus: Secondary | ICD-10-CM

## 2016-12-24 DIAGNOSIS — Z01419 Encounter for gynecological examination (general) (routine) without abnormal findings: Secondary | ICD-10-CM

## 2016-12-24 DIAGNOSIS — Z1159 Encounter for screening for other viral diseases: Secondary | ICD-10-CM | POA: Insufficient documentation

## 2016-12-24 DIAGNOSIS — Z23 Encounter for immunization: Secondary | ICD-10-CM

## 2016-12-24 DIAGNOSIS — I10 Essential (primary) hypertension: Secondary | ICD-10-CM

## 2016-12-24 LAB — COMPLETE METABOLIC PANEL WITH GFR
AG RATIO: 1.6 (calc) (ref 1.0–2.5)
ALT: 16 U/L (ref 6–29)
AST: 18 U/L (ref 10–35)
Albumin: 4.7 g/dL (ref 3.6–5.1)
Alkaline phosphatase (APISO): 55 U/L (ref 33–130)
BUN: 17 mg/dL (ref 7–25)
CALCIUM: 9.5 mg/dL (ref 8.6–10.4)
CO2: 25 mmol/L (ref 20–32)
CREATININE: 0.72 mg/dL (ref 0.50–0.99)
Chloride: 104 mmol/L (ref 98–110)
GFR, EST AFRICAN AMERICAN: 105 mL/min/{1.73_m2} (ref >=60)
GFR, EST NON AFRICAN AMERICAN: 90 mL/min/{1.73_m2} (ref >=60)
GLUCOSE: 106 mg/dL — AB (ref 65–99)
Globulin: 2.9 g/dL (calc) (ref 1.9–3.7)
Potassium: 4.2 mmol/L (ref 3.5–5.3)
Sodium: 139 mmol/L (ref 135–146)
TOTAL PROTEIN: 7.6 g/dL (ref 6.1–8.1)
Total Bilirubin: 0.6 mg/dL (ref 0.2–1.2)

## 2016-12-24 LAB — POCT URINALYSIS DIP (DEVICE)
BILIRUBIN URINE: NEGATIVE
Glucose, UA: NEGATIVE mg/dL
KETONES UR: NEGATIVE mg/dL
Leukocytes, UA: NEGATIVE
Nitrite: NEGATIVE
PH: 6 (ref 5.0–8.0)
Protein, ur: NEGATIVE mg/dL
SPECIFIC GRAVITY, URINE: 1.015 (ref 1.005–1.030)
Urobilinogen, UA: 0.2 mg/dL (ref 0.0–1.0)

## 2016-12-24 LAB — POCT GLYCOSYLATED HEMOGLOBIN (HGB A1C): HEMOGLOBIN A1C: 5.5

## 2016-12-24 NOTE — Progress Notes (Signed)
Patient ID: Molly Rodriguez, female    DOB: [birth year], 61 y.o.   MRN: [id number]  PCP: Bing Neighbors, FNP  Chief Complaint  Patient presents with  . Follow-up    4 MONTHS    Subjective:  HPI Molly Rodriguez is a 61 y.o. female presents for routine gynecological visit. Ndea reports no prior PAP. She is an immigrant to [place] and is present today accompanied by her daughter. Denies vaginal bleeding, discharge, dysuria, lower pelvic pain, breast pain, or breast nodule/mass. During her last office visit, she was encouraged to obtain a screening mammogram, although she hasn't scheduled appointment. No known family history of breast cancer. Reports adherence with antihypertension medications. [name] has a history of alcohol abuse and has no prior hepatitis screening. Patient agrees to Hepatitis B and C screenings today. Denies chest pain, headaches, shortness of breath, or dizziness. [name] denies any other complaints today.  Social History   Social History  . Marital status: Single    Spouse name: N/A  . Number of children: N/A  . Years of education: N/A   Occupational History  . Not on file.   Social History Main Topics  . Smoking status: Never Smoker  . Smokeless tobacco: Never Used  . Alcohol use 1.2 oz/week    2 Cans of beer per week     Comment: daily  . Drug use: No  . Sexual activity: Yes    Birth control/ protection: Post-menopausal   Other Topics Concern  . Not on file   Social History Narrative  . No narrative on file   History reviewed. No pertinent family history. Review of Systems Constitutional: Negative for fever, chills, diaphoresis, activity change, appetite change and fatigue. Respiratory: Negative for cough, choking, chest tightness, shortness of breath, wheezing and stridor.  Cardiovascular: Negative for chest pain, palpitations and leg swelling. Gastrointestinal: Negative for abdominal distention. Genitourinary: Negative for dysuria, urgency, frequency,  hematuria, flank pain, decreased urine volume, difficulty urinating and dyspareunia.  Psychiatric/Behavioral: Negative for hallucinations, behavioral problems, confusion, dysphoric mood, decreased concentration and agitation.   Patient Active Problem List   Diagnosis Date Noted  . Hyponatremia 12/15/2015  . Hypokalemia 12/15/2015  . History of alcohol dependence (HCC) 12/15/2015  . Dysuria 11/12/2010  . Hypertension 11/06/2010    No Known Allergies  Prior to Admission medications   Medication Sig Start Date End Date Taking? Authorizing Provider  amLODipine (NORVASC) 10 MG tablet Take 1 tablet (10 mg total) by mouth daily. 08/23/16  Yes Bing Neighbors, FNP  lisinopril (PRINIVIL,ZESTRIL) 30 MG tablet Take 1 tablet (30 mg total) by mouth daily. 08/23/16  Yes Bing Neighbors, FNP  metoprolol succinate (TOPROL XL) 25 MG 24 hr tablet Take 2 tablets (50 mg total) by mouth daily. 08/23/16  Yes Bing Neighbors, FNP  potassium chloride SA (K-DUR,KLOR-CON) 20 MEQ tablet Take 1 tablet (20 mEq total) by mouth daily. 07/10/16 07/15/16  Bing Neighbors, FNP    Past Medical, Surgical Family and Social History reviewed and updated.    Objective:   Today's Vitals   12/24/16 1501  BP: 140/78  Pulse: 74  Resp: 14  Temp: 98.7 F (37.1 C)  TempSrc: Oral  SpO2: 100%  Weight: 107 lb (48.5 kg)  Height: 5' (1.524 m)    Wt Readings from Last 3 Encounters:  12/24/16 107 lb (48.5 kg)  08/23/16 106 lb 4 oz (48.2 kg)  07/09/16 106 lb (48.1 kg)   Physical Exam Physical Exam: Constitutional: Patient appears  well-developed and well-nourished. No distress. HENT: Normocephalic, atraumatic, External right and left ear normal. Oropharynx is clear and moist.  Eyes: Conjunctivae and EOM are normal. PERRLA, no scleral icterus. Neck: Normal ROM. Neck supple. No JVD. No tracheal deviation. No thyromegaly. CVS: RRR, S1/S2 +, no murmurs, no gallops, no carotid bruit.  Pulmonary: Effort and breath  sounds normal.  Abdominal: No distension, tenderness, rebound or guarding.  Genitourinary: Breasts are symmetric without cutaneous changes, nipple inversion or discharge. No masses or tenderness, and no axillary lymphadenopathy. Normal female external genitalia without lesion. No inguinal lymphadenopathy. Vaginal mucosa is pink and moist without lesions. Cervix is without discharge, not friable. Pap smear obtained. No cervical motion tenderness, adnexal fullness or tenderness. Musculoskeletal: Normal range of motion. No edema and no tenderness.  Lymphadenopathy: No lymphadenopathy noted, inguinal or axillary Neuro: Alert. muscle tone coordination.  Skin: Skin is warm and dry. No rash noted. Not diaphoretic. No erythema. No pallor. Psychiatric: Normal mood and affect. Behavior, judgment, thought content normal.   Assessment & Plan:  1. Encounter for annual routine gynecological examination- Cytology - PAP [place]. Encouraged to obtain mammogram. 2. Essential hypertension, stable, no changes in medication regimen.  3. Screening for diabetes mellitus, normal today A1C 5.5. 4. Encounter for hepatitis C screening test for low risk patient 5. Need for hepatitis B screening test 6. Need for immunization against influenza, vaccination administered.   Orders Placed This Encounter  Procedures  . Flu Vaccine QUAD 36+ mos IM  . Hepatitis C antibody  . Hepatitis B surface antigen  . Hepatitis B surface antibody  . COMPLETE METABOLIC PANEL WITH GFR  . POCT glycosylated hemoglobin (Hb A1C)  . POCT urinalysis dip (device)    Return in 6 months for routine wellness follow-up.    Godfrey PickKimberly S. Tiburcio PeaHarris, MSN, FNP-C [facility]  [address] [place], [place] [address] [phone]

## 2016-12-24 NOTE — Patient Instructions (Signed)
To schedule your breast exam, please call [facility] at   Phone: [phone]   

## 2016-12-25 LAB — HEPATITIS C ANTIBODY
Hepatitis C Ab: NONREACTIVE
SIGNAL TO CUT-OFF: 0.02 (ref <1.00)

## 2016-12-25 LAB — HEPATITIS B SURFACE ANTIBODY, QUANTITATIVE: Hepatitis B-Post: 13 m[IU]/mL (ref >=10)

## 2016-12-25 LAB — HEPATITIS B SURFACE ANTIGEN: Hepatitis B Surface Ag: NONREACTIVE

## 2016-12-25 LAB — EXTRA LAV TOP TUBE

## 2016-12-27 ENCOUNTER — Encounter: Payer: Self-pay | Admitting: Family Medicine

## 2016-12-27 LAB — CYTOLOGY - PAP
Bacterial vaginitis: NEGATIVE
CANDIDA VAGINITIS: NEGATIVE
Chlamydia: NEGATIVE
DIAGNOSIS: NEGATIVE
Neisseria Gonorrhea: NEGATIVE
Trichomonas: NEGATIVE

## 2016-12-28 LAB — CERVICOVAGINAL ANCILLARY ONLY: HERPES (WINDOWPATH): NEGATIVE

## 2017-03-12 IMAGING — DX DG CHEST 2V
2 series · 2 of 2 positions shown · non-contrast
Comparison: Prior radiograph from 09/08/2010.

CLINICAL DATA: Initial evaluation for acute shortness of breath.

EXAM:
CHEST  2 VIEW

[chest lat]
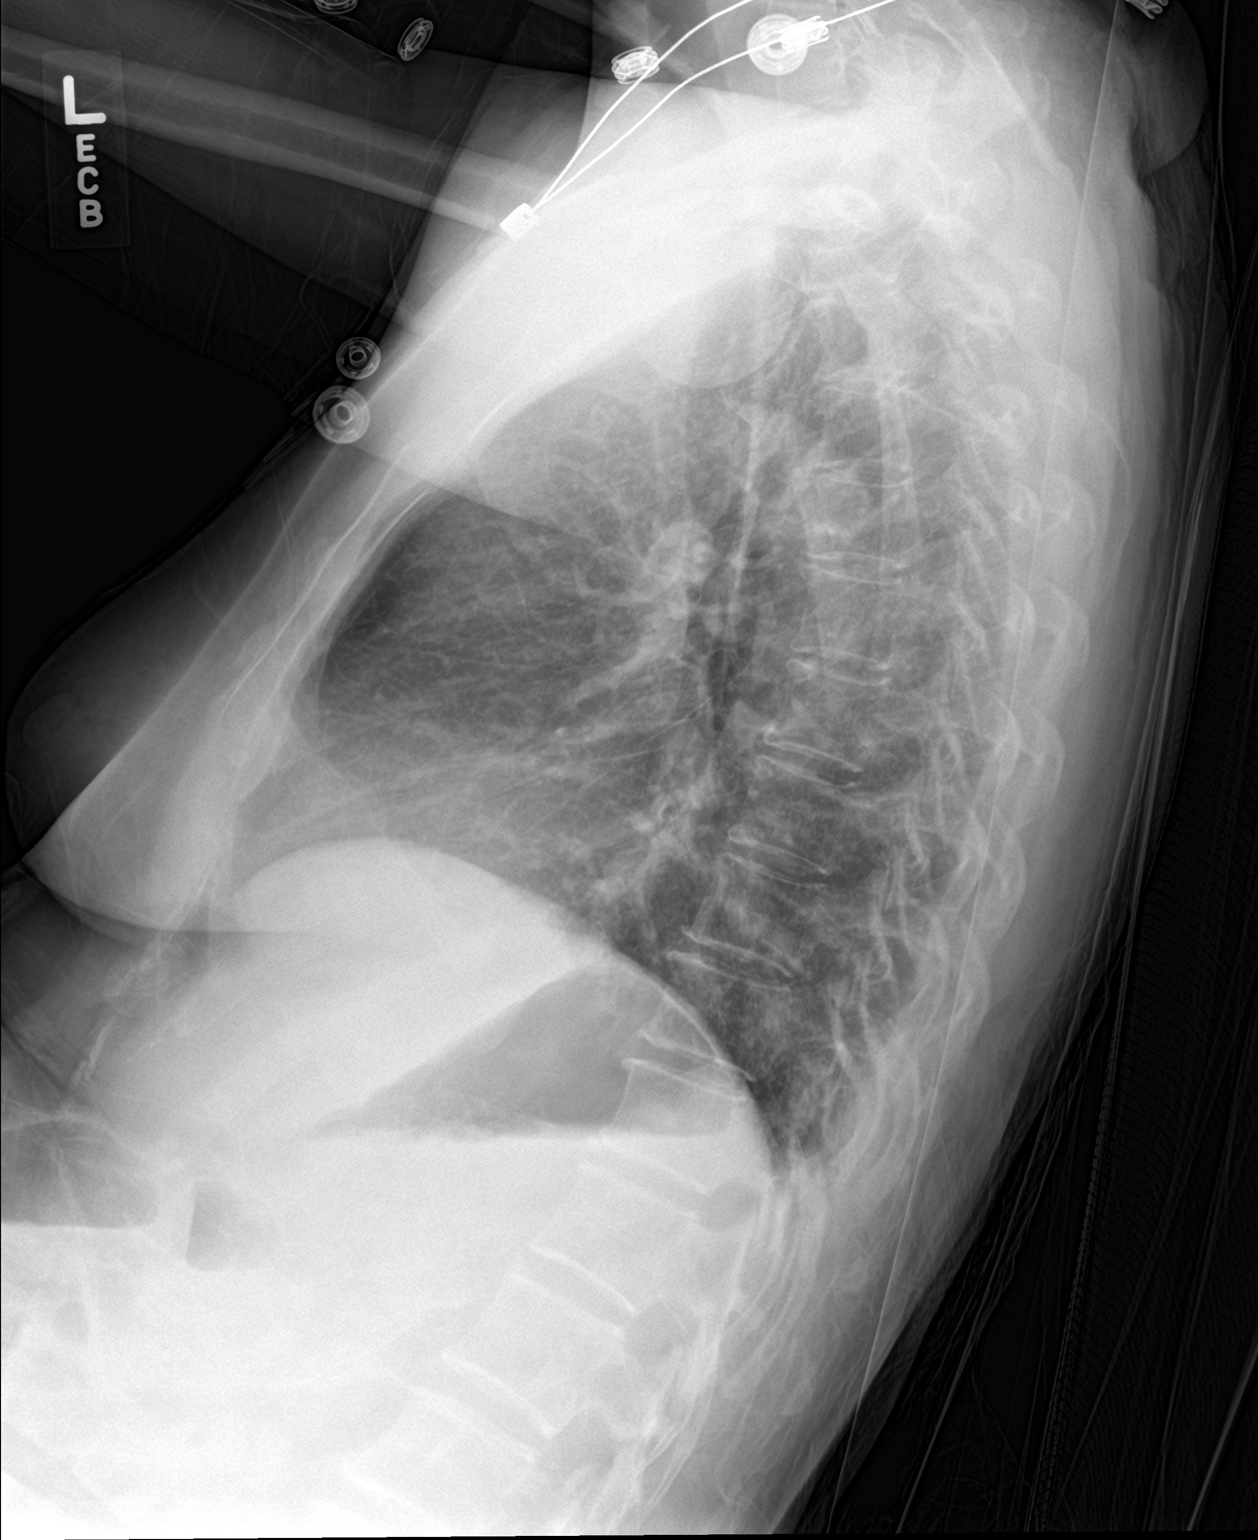

[chest ap]
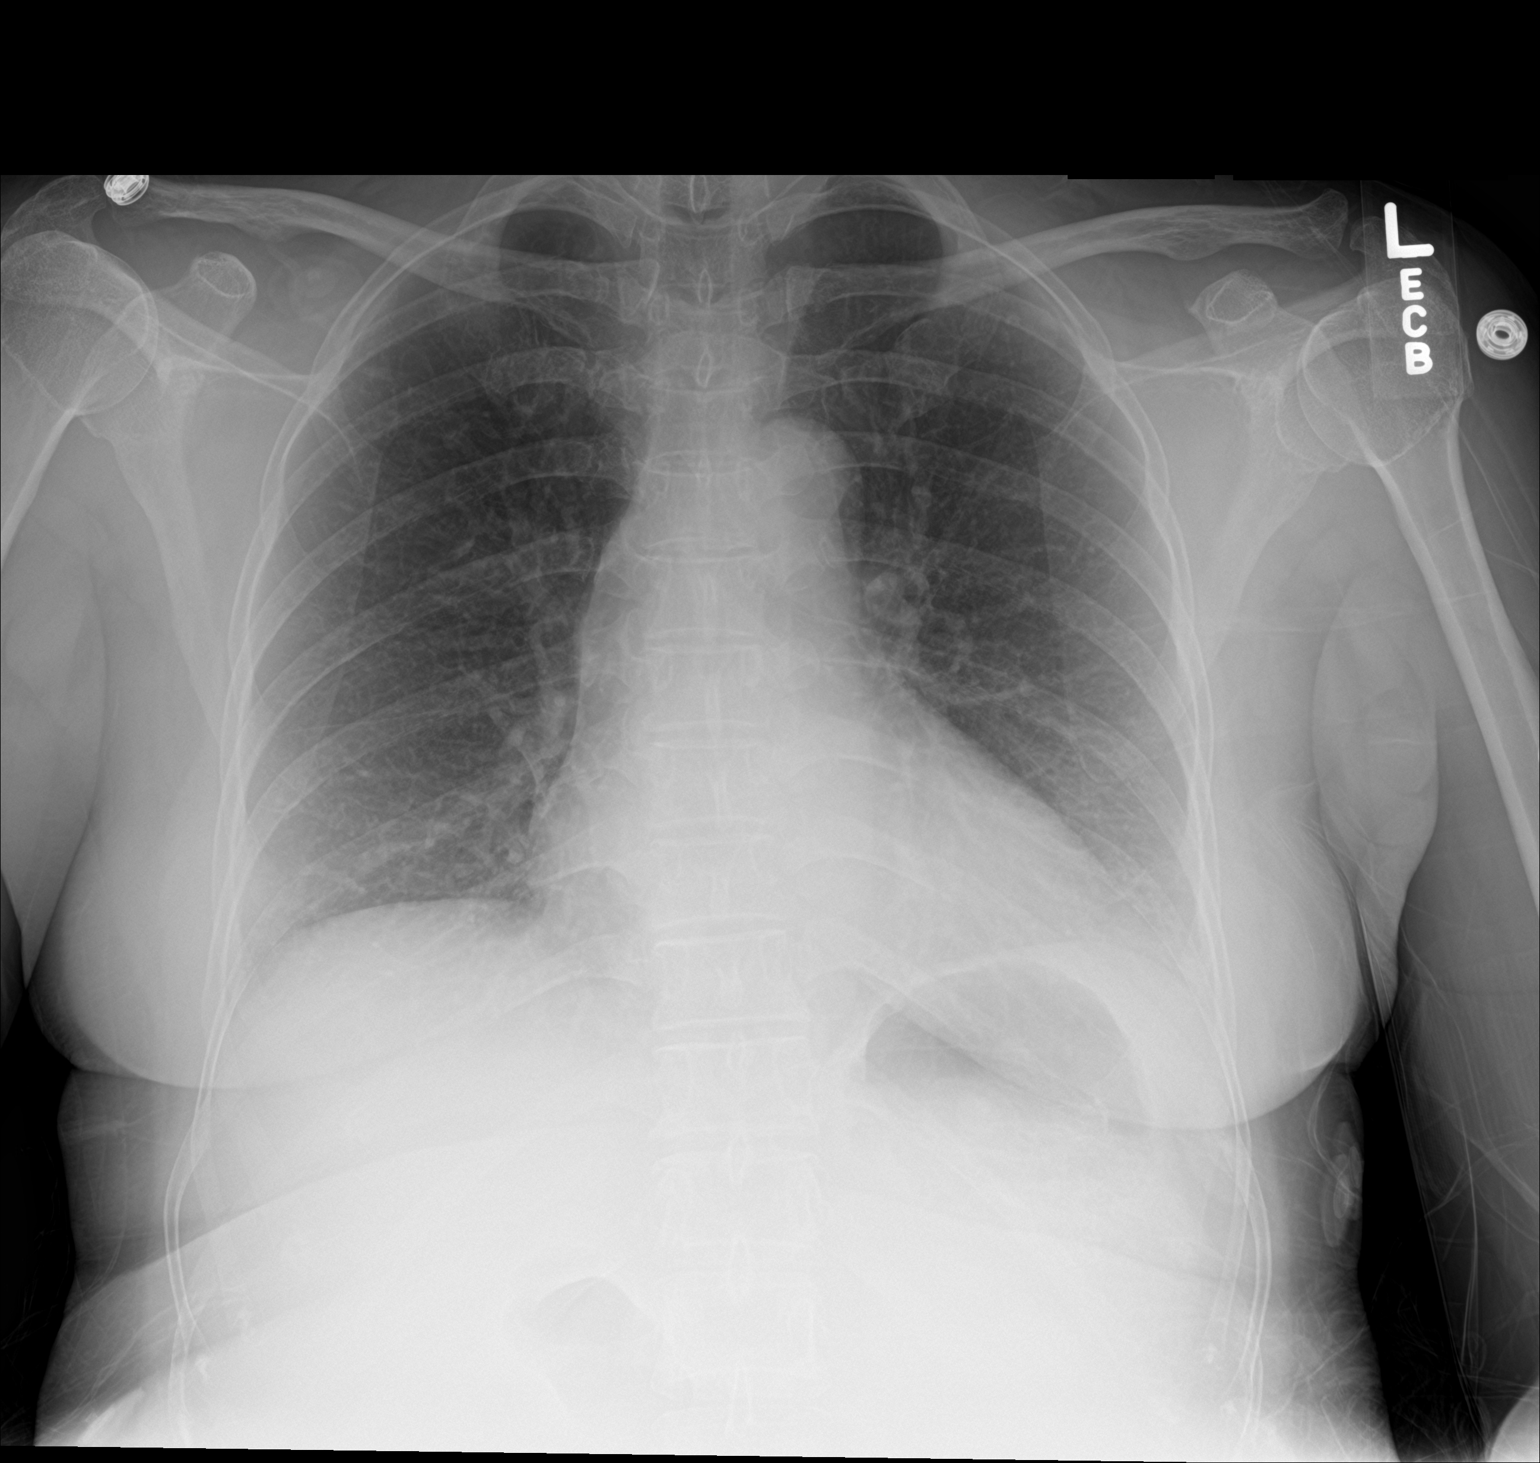

[2 of 2 positions shown; findings below may reference images not displayed]

FINDINGS: The cardiac and mediastinal silhouettes are stable in size and
contour, and remain within normal limits.

The lungs are normally inflated. No airspace consolidation, pleural
effusion, or pulmonary edema is identified. There is no
pneumothorax.

No acute osseous abnormality identified.
IMPRESSION: No active cardiopulmonary disease.

## 2017-06-03 ENCOUNTER — Other Ambulatory Visit: Payer: Self-pay | Admitting: Family Medicine

## 2017-06-24 ENCOUNTER — Ambulatory Visit (INDEPENDENT_AMBULATORY_CARE_PROVIDER_SITE_OTHER): Payer: Self-pay | Admitting: Family Medicine

## 2017-06-24 ENCOUNTER — Encounter: Payer: Self-pay | Admitting: Family Medicine

## 2017-06-24 VITALS — BP 132/88 | HR 88 | Temp 98.4°F | Resp 14 | Ht 60.0 in | Wt 113.0 lb

## 2017-06-24 DIAGNOSIS — I159 Secondary hypertension, unspecified: Secondary | ICD-10-CM

## 2017-06-24 LAB — POCT URINALYSIS DIPSTICK
BILIRUBIN UA: NEGATIVE
Blood, UA: NEGATIVE
GLUCOSE UA: NEGATIVE
Ketones, UA: NEGATIVE
Nitrite, UA: NEGATIVE
PH UA: 6 (ref 5.0–8.0)
Protein, UA: NEGATIVE
Spec Grav, UA: <=1.005 — AB (ref 1.010–1.025)
Urobilinogen, UA: 0.2 E.U./dL

## 2017-06-24 NOTE — Progress Notes (Signed)
Subjective:    Patient ID: Molly Rodriguez, female    DOB: Aug 04, 1955, 62 y.o.   MRN: [id number]  Baillie Mohammad, a 62 year old female presents for six-month follow-up of hypertension.  Patient primarily speaks minimal english, using daughter to assist with communication. Patient has been taking medication and has been following a balanced diet. She does not exercise routinely, but remains very active throughout the day.   Hypertension  The problem is controlled. Pertinent negatives include no chest pain, headaches, neck pain, orthopnea, palpitations, peripheral edema, shortness of breath or sweats. There are no associated agents to hypertension. There are no compliance problems.  There is no history of kidney disease, CAD/MI or heart failure.     Past Medical History:  Diagnosis Date  . Hypercholesteremia   . Hypertension    Social History   Socioeconomic History  . Marital status: Single    Spouse name: Not on file  . Number of children: Not on file  . Years of education: Not on file  . Highest education level: Not on file  Occupational History  . Not on file  Social Needs  . Financial resource strain: Not on file  . Food insecurity:    Worry: Not on file    Inability: Not on file  . Transportation needs:    Medical: Not on file    Non-medical: Not on file  Tobacco Use  . Smoking status: Never Smoker  . Smokeless tobacco: Never Used  Substance and Sexual Activity  . Alcohol use: Yes    Alcohol/week: 1.2 oz    Types: 2 Cans of beer per week    Comment: daily  . Drug use: No  . Sexual activity: Yes    Birth control/protection: Post-menopausal  Lifestyle  . Physical activity:    Days per week: Not on file    Minutes per session: Not on file  . Stress: Not on file  Relationships  . Social connections:    Talks on phone: Not on file    Gets together: Not on file    Attends religious service: Not on file    Active member of club or organization: Not on file    Attends  meetings of clubs or organizations: Not on file    Relationship status: Not on file  . Intimate partner violence:    Fear of current or ex partner: Not on file    Emotionally abused: Not on file    Physically abused: Not on file    Forced sexual activity: Not on file  Other Topics Concern  . Not on file  Social History Narrative  . Not on file   Immunization History  Administered Date(s) Administered  . Influenza Whole 11/06/2010  . Influenza,inj,Quad PF,6+ Mos 11/14/2015, 12/24/2016  . Tdap 08/23/2016    Review of Systems  Constitutional: Negative.   Eyes: Negative.   Respiratory: Negative for shortness of breath.   Cardiovascular: Negative for chest pain, palpitations and orthopnea.  Gastrointestinal: Negative.   Endocrine: Negative.   Genitourinary: Negative.   Musculoskeletal: Negative.  Negative for neck pain.  Skin: Negative.   Allergic/Immunologic: Negative.   Neurological: Negative.  Negative for headaches.  Hematological: Negative.        Objective:   Physical Exam  Constitutional: She is oriented to person, place, and time. She appears well-developed and well-nourished.  HENT:  Head: Normocephalic and atraumatic.  Right Ear: External ear normal.  Left Ear: External ear normal.  Eyes: Pupils are equal, round, and  reactive to light.  Neck: Normal range of motion.  Cardiovascular: Normal rate, regular rhythm, normal heart sounds and intact distal pulses.  Pulmonary/Chest: Effort normal and breath sounds normal.  Abdominal: Soft. Bowel sounds are normal.  Neurological: She is alert and oriented to person, place, and time.  Skin: Skin is warm and dry.  Psychiatric: She has a normal mood and affect. Her behavior is normal. Judgment and thought content normal.         BP 132/88 (BP Location: Right Arm, Patient Position: Sitting, Cuff Size: Normal) Comment: manually  Pulse 88   Temp 98.4 F (36.9 C) (Oral)   Resp 14   Ht 5' (1.524 m)   Wt 113 lb (51.3  kg)   SpO2 100%   BMI 22.07 kg/m  Assessment & Plan:  1. Secondary hypertension Blood pressure is at goal on current medication regimen. Will continue amlodipine 10 mg, lisinopril 30 mg and metoprolol 25 mg.  Reviewed urinalysis, no proteinuria present We have discussed target BP range and blood pressure goal. I have advised patient to check BP regularly and to call us back or report to clinic if the numbers are consistently higher than 140/90. We discussed the importance of compliance with medical therapy and DASH diet recommended, consequences of uncontrolled hypertension discussed.  - continue current BP medications  - Urinalysis Dipstick - Basic Metabolic Panel   Nolon Nations  MSN, FNP-C [facility] [address] [place], [place] [address] [phone]

## 2017-06-24 NOTE — Patient Instructions (Signed)
T?ng huy?t áp  Hypertension  T?ng huy?t áp, th??ng ???c g?i là huy?t áp cao, là khi l?c b?m máu qua ??ng m?ch c?a quý v? quá m?nh. ??ng m?ch c?a quý v? là các m?ch máu mang máu t? tim ?i kh?p c? th?. T?ng huy?t áp khi?n tim làm vi?c v?t v? h?n ?? b?m máu và có th? khi?n các ??ng m?ch tr? lên h?p ho?c c?ng. T?ng huy?t áp không ???c ?i?u tr? ho?c không ki?m soát ???c có th? d?n t?i nh?i máu c? tim, ??t qu?, b?nh th?n và nh?ng v?n ?? khác.  Ch? s? ?o huy?t áp g?m m?t ch? s? cao trên m?t ch? s? th?p. Huyê?t a?p ly? t???ng cu?a quy? vi? la? d??i 120/80. Ch? s? ??u tiên ("??nh") ???c g?i là huy?t áp tâm thu. ?ây là s? ?o áp su?t trong ??ng m?ch khi tim quý v? ??p. Ch? s? th? hai ("?áy") ???c g?i là huy?t áp tâm tr??ng. ?ây là s? ?o áp su?t trong ??ng m?ch khi tim quý v? ngh?.  Nguyên nhân gì gây ra?  Không rõ nguyên nhân gây ra tình tr?ng này.  ?i?u gì làm t?ng nguy c??  M?t s? y?u t? nguy c? d?n ??n huy?t áp cao có th? ki?m soát ???c. M?t s? y?u t? khác thì không.  Nh?ng y?u t? quý v? có th? thay ??i  · Hút thu?c.  · B? b?nh ti?u ???ng tuýp 2, cholesterol cao, ho?c c? hai.  · Không t?p th? d?c ho?c các ho?t ??ng th? ch?t ??y ??.  · Th?a cân.  · ?n quá nhi?u ch?t béo, ???ng, ca-lo, ho?c mu?i (Natri).  · U?ng quá nhi?u r??u.  Nh?ng y?u t? khó ho?c không th? thay ??i  · B?nh th?n m?n tính.  · Có ti?n s? gia ?ình b? cao huy?t áp.  · ?? tu?i. Nguy c? t?ng lên theo ?? tu?i.  · Ch?ng t?c. Quý v? có th? có nguy c? cao h?n n?u quý v? là ng??i M? g?c Phi.  · Gi?i tính. Nam gi?i có nguy c? cao h?n ph? n? tr??c tu?i 45. Sau tu?i 65, ph? n? có nguy c? cao h?n nam gi?i.  · Ng?ng th? do t?c ngh?n khi ng?.  · C?ng th?ng.  Các d?u hi?u ho?c tri?u ch?ng là gì?  Huy?t áp quá cao (c?n t?ng huy?t áp) có th? gây ra:  · ?au ??u.  · Lo âu.  · Khó th?.  · Ch?y máu cam.  · Bu?n nôn và nôn.  · ?au ng?c n?ng.  · C? ??ng gi?t gi?t quý v? không th? ki?m soát ???c (co gi?t).    Ch?n ?oán tình tr?ng này nh? th? nào?   Tình tr?ng này ???c ch?n ?oán b?ng cách ?o huy?t áp c?a quý v? lúc quý v? ng?i, ?? tay trên m?t m?t ph?ng. B?ng qu?n thi?t b? ?o huy?t áp s? ???c qu?n tr?c ti?pvào vùng da cánh tay phía trên c?a quý v? ngang v?i m?c tim. Huy?t áp c?n ???c ?o ít nh?t hai l?n trên cùng m?t cánh tay. M?t s? tình tr?ng nh?t ??nh có th? làm cho huy?t áp khác nhau gi?a tay ph?i và tay trái c?a quý v?.  M?t s? y?u t? nh?t ??nh có th? khi?n ch? s? ?o huy?t áp th?p h?n ho?c cao h?n so v?i bình th??ng (t?ng) trong th?i gian ng?n:  · Khi huy?t áp c?a quý v? ? phòng khám c?a chuyên gia ch?m sóc s?c kh?e cao   h?n so v?i lúc quý v? ? nhà, hi?n t??ng này ???c g?i là t?ng huy?t áp áo choàng tr?ng. H?u h?t nh?ng ng??i b? tình tr?ng này ??u không c?n dùng thu?c.  · Khi huy?t áp c?a quý v? lúc ? nhà cao h?n so v?i lúc quý v? ? phòng khám chuyên gia ch?m sóc s?c kh?e, hi?n t??ng này ???c g?i là t?ng huy?t áp m?t n?. H?u h?t nh?ng ng??i b? tình tr?ng này ??u có th? c?n dùng thu?c ?? ki?m soát huy?t áp.    N?u quý v? có ch? s? huy?t áp cao trong m?t l?n khám ho?c quý v? có huy?t áp bình th??ng có kèm các y?u t? nguy c? khác:  · Quý v? có th? ???c yêu c?u tr? l?i vào m?t ngày khác ?? ki?m tra l?i huy?t áp.  · Quý v? có th? ???c yêu c?u theo dõi huy?t áp t?i nhà trong vòng 1 tu?n ho?c lâu h?n.    N?u quý v? ???c ch?n ?oán b? t?ng huy?t áp, quý v? có th? c?n th?c hi?n các xét nghi?m máu ho?c ki?m tra hình ?nh khác ?? giúp chuyên gia ch?m sóc s?c kh?e hi?u nguy c? t?ng th? m?c các b?nh tr?ng khác.  Tình tr?ng này ???c ?i?u tr? nh? th? nào?  Tình tr?ng này ???c ?i?u tr? b?ng cách thay ??i l?i s?ng lành m?nh, ch?ng h?nh nh? ?n th?c ph?m có l?i cho s?c kh?e, t?p th? d?c nhi?u h?n và gi?m l??ng r??u u?ng vào. N?u thay ??i l?i s?ng không ?? ?? ??a huy?t áp v? m?c có th? ki?m soát ???c, chuyên gia ch?m sóc s?c kh?e có th? kê ??n thu?c, và n?u:  · Huy?t áp tâm thu c?a quý v? trên 130.  · Huy?t áp tâm tr??ng c?a quý v? trên 80.     Huy?t áp m?c tiêu cá nhân c?a quý v? có th? khác nhau tùy thu?c và tình tr?ng b?nh lý, tu?i và các nhân t? khác.  Tuân th? nh?ng h??ng d?n này ? nhà:  ?n và u?ng  · ?n ch? ?? giàu ch?t x? và kali và ít natri, ???ng ph? gia và ch?t béo. M?t k? ho?ch ?n m?u có tên ch? ?? ?n DASH (Cách ti?p c?n ?n u?ng ?? gi?m t?ng huy?t áp). ?n theo cách này:  ? ?n nhi?u trái cây và rau t??i. Vào m?i b?a ?n, c? g?ng dành m?t n?a ??a cho trái cây và rau.  ? ?n ng? c?c nguyên h?t, ch?ng h?n nh? mì ?ng làm t? b?t mì nguyên cám, ho?c bánh mì nguyên h?t. Cho ngu? cô?c nguyên ca?m va?o m?t ph?n t? ??a c?a quy? vi?.  ? ?n ho?c hu?ng các s?n ph?m t? s?a ít béo, ch?ng h?n nh? s?a ?ã b? kem ho?c s?a chua ít béo.  ? Tránh nh?ng mi?ng th?t nhi?u m?, th?t ?ã qua ch? bi?n ho?c th?t ??p mu?i và th?t gia c?m có da. Dành kho?ng m?t ph?n t? ??a c?a quý v? cho các protein không m?, ch?ng h?n nh? cá, th?t gà không da, ??u, tr?ng, và ??u ph?.  ? Tránh nh?ng th?c ph?m ch? bi?n ho?c làm s?n. Nh?ng th?c ph?m này th??ng có nhi?u natri, ???ng ph? gia và ch?t béo h?n.  · Gi?m l??ng dùng natri hàng ngày c?a quý v?. H?u h?t nh?ng ng??i b? t?ng huy?t áp ??u nên ?n d??i 1.500 mg natri m?i ngày.  · Gi?i h?n l??ng r??u quý v? u?ng không quá 1   ly m?i ngày v?i ph? n? không mang thai và 2 ly m?i ngày v?i nam gi?i. M?t ly t??ng ???ng v?i 12 ao-x? bia, 5 ao-x? r??u vang, ho?c 1½ ao-x? r??u m?nh.  L?i s?ng  · H?p tác v?i chuyên gia ch?m sóc s?c kh?e c?a quý v? ?? duy trì tr?ng l??ng c? th? có l?i cho s?c kh?e ho?c gi?m cân. Hãy h?i xem tr?ng l??ng nào là lý t??ng cho quý v?.  · Dành ít nh?t 30 phút ?? t?p th? d?c mà có th? khi?n tim quý v? ??p nhanh h?n (t?p th? d?c nh?p ?i?u) h?u h?t các ngày trong tu?n. Các ho?t ??ng có th? bao g?m ?i b?, b?i, ho?c ??p xe.  · Bao g?m bài t?p t?ng c??ng c? (bài t?p kháng l?c), ch?ng h?n nh? bài t?p Pilates ho?c nâng t?, nh? m?t ph?n c?a thói quen luy?n t?p hàng tu?n c?a  quý v?. C? g?ng t?p nh?ng lo?i bài t?p này trong vòng 30 phút t?i thi?u 3 ngày m?t tu?n.  · Không s? d?ng b?t k? s?n ph?m nào ch?a nicotine ho?c thu?c lá, ch?ng ha?n nh? thu?c lá d?ng hút và thu?c lá ?i?n t?. N?u quý v? c?n giúp ?? ?? cai thu?c, hãy h?i chuyên gia ch?m sóc s?c kh?e.  · Theo dõi huy?t áp c?a quý v? t?i nhà theo h??ng d?n c?a chuyên gia ch?m sóc s?c kh?e.  · Tuân th? t?t c? các cu?c h?n khám l?i theo ch? d?n c?a chuyên gia ch?m sóc s?c kh?e. ?i?u này có vai trò quan tr?ng.  Thu?c  · Ch? s? d?ng thu?c không kê ??n và thu?c kê ??n theo ch? d?n c?a chuyên gia ch?m sóc s?c kh?e. Làm theo ch? d?n m?t cách c?n th?n. Thu?c ?i?u tr? huy?t áp ph?i ???c dùng theo ??n ?ã kê.  · Không b? li?u thu?c huy?t áp. B? li?u khi?n quý v? có nguy c? g?p ph?i các v?n ?? và có th? làm cho thu?c gi?m hi?u qu?.  · Hãy h?i chuyên gia ch?m sóc s?c kh?e c?a quý v? v? nh?ng tác d?ng ph? ho?c ph?n ?ng v?i thu?c mà quý v? ph?i theo dõi.  Hãy liên l?c v?i chuyên gia ch?m sóc s?c kh?e n?u:  · Quý v? ngh? quý v? có ph?n ?ng v?i thu?c ?ang dùng.  · Quý v? b? ?au ??u ti?p t?c tr? l?i (tái phát).  · Quý v? c?m th?y chóng m?t.  · Quý v? b? s?ng phù ? m?t cá chân.  · Quý v? có v?n ?? v? th? l?c.  Yêu c?u tr? giúp ngay l?p t?c n?u:  · Quý v? b? ?au ??u n?ng ho?c lú l?n.  · Quý v? b? y?u b?t th??ng ho?c tê bì.  · Quy? vi? ca?m thâ?y bi? ngâ?t.  · Quý v? b? ?au r?t nhi?u ? ng?c ho?c b?ng.  · Quý v? nôn nhi?u l?n.  · Quý v? b? khó th?.  Tóm t?t  · T?ng huy?t áp là khi l?c b?m máu qua ??ng m?ch c?a quý v? quá m?nh. N?u tình tr?ng này không ???c ki?m soát, nó có th? khi?n quý v? g?p ph?i nguy c? bi?n ch?ng nghiêm tr?ng.  · Huy?t áp m?c tiêu cá nhân c?a quý v? có th? khác nhau tùy thu?c và tình tr?ng b?nh lý, tu?i và các nhân t? khác. ??i v?i h?u h?t m?i ng??i, huy?t áp bình th??ng là d??i 120/80.  · ?i?u tr? t?ng huy?t áp b?ng cách   thay ??i l?i s?ng, dùng thu?c, ho?c k?t h?p c? hai. Thay ??i l?i s?ng bao g?m gi?m cân, ?n ch? ?? ?n có l?i cho  s?c kh?e, ít mu?i, t?p th? d?c nhi?u h?n và h?n ch? u?ng r??u.  Thông tin này không nh?m m?c ?ích thay th? cho l?i khuyên mà chuyên gia ch?m sóc s?c kh?e nói v?i quý v?. Hãy b?o ??m quý v? ph?i th?o lu?n b?t k? v?n ?? gì mà quý v? có v?i chuyên gia ch?m sóc s?c kh?e c?a quý v?.  Document Released: 02/12/2005 Document Revised: 01/25/2016 Document Reviewed: 01/25/2016  Elsevier Interactive Patient Education © 2018 [employer]

## 2017-06-25 LAB — BASIC METABOLIC PANEL
BUN/Creatinine Ratio: 14 (ref 12–28)
BUN: 10 mg/dL (ref 8–27)
CALCIUM: 9.4 mg/dL (ref 8.7–10.3)
CHLORIDE: 104 mmol/L (ref 96–106)
CO2: 19 mmol/L — AB (ref 20–29)
Creatinine, Ser: 0.7 mg/dL (ref 0.57–1.00)
GFR calc non Af Amer: 94 mL/min/{1.73_m2} (ref >59)
GFR, EST AFRICAN AMERICAN: 108 mL/min/{1.73_m2} (ref >59)
GLUCOSE: 97 mg/dL (ref 65–99)
POTASSIUM: 4 mmol/L (ref 3.5–5.2)
Sodium: 139 mmol/L (ref 134–144)

## 2017-07-01 ENCOUNTER — Telehealth: Payer: Self-pay

## 2017-07-01 NOTE — Telephone Encounter (Signed)
-----   Message from Massie Maroon, [place] sent at 06/26/2017  7:22 PM EDT ----- Regarding: lab resutls Please inform patient that all laboratory results are within a normal range, no medication changes are warranted at this time. Please follow up in office as scheduled.   Nolon Nations  MSN, FNP-C Patient Care [facility] [address] [place], [place] [address] [phone]

## 2017-07-01 NOTE — Telephone Encounter (Signed)
Left a vm for patient to callback 

## 2017-07-29 ENCOUNTER — Other Ambulatory Visit: Payer: Self-pay | Admitting: Family Medicine

## 2017-08-16 ENCOUNTER — Telehealth: Payer: Self-pay

## 2017-08-16 NOTE — Telephone Encounter (Signed)
Left a vm for patient to callback and let us know which medication they need

## 2017-08-26 ENCOUNTER — Other Ambulatory Visit: Payer: Self-pay | Admitting: Family Medicine

## 2017-09-19 ENCOUNTER — Other Ambulatory Visit: Payer: Self-pay | Admitting: Family Medicine

## 2017-09-23 ENCOUNTER — Encounter: Payer: Self-pay | Admitting: Family Medicine

## 2017-09-23 ENCOUNTER — Ambulatory Visit (INDEPENDENT_AMBULATORY_CARE_PROVIDER_SITE_OTHER): Payer: Self-pay | Admitting: Family Medicine

## 2017-09-23 VITALS — BP 140/72 | HR 88 | Temp 98.8°F | Ht 60.0 in | Wt 109.0 lb

## 2017-09-23 DIAGNOSIS — Z09 Encounter for follow-up examination after completed treatment for conditions other than malignant neoplasm: Secondary | ICD-10-CM

## 2017-09-23 DIAGNOSIS — Z131 Encounter for screening for diabetes mellitus: Secondary | ICD-10-CM

## 2017-09-23 DIAGNOSIS — I10 Essential (primary) hypertension: Secondary | ICD-10-CM

## 2017-09-23 LAB — POCT URINALYSIS DIP (MANUAL ENTRY)
Bilirubin, UA: NEGATIVE
Blood, UA: NEGATIVE
Glucose, UA: NEGATIVE mg/dL
Ketones, POC UA: NEGATIVE mg/dL
Leukocytes, UA: NEGATIVE
Nitrite, UA: NEGATIVE
Protein Ur, POC: NEGATIVE mg/dL
Spec Grav, UA: 1.01 (ref 1.010–1.025)
Urobilinogen, UA: 0.2 E.U./dL
pH, UA: 6 (ref 5.0–8.0)

## 2017-09-23 LAB — POCT GLYCOSYLATED HEMOGLOBIN (HGB A1C): Hemoglobin A1C: 5.7 % — AB (ref 4.0–5.6)

## 2017-09-23 MED ORDER — LISINOPRIL 30 MG PO TABS: 30.0000 mg | ORAL_TABLET | Freq: Every day | ORAL | 1 refills | Status: DC

## 2017-09-23 NOTE — Progress Notes (Signed)
Follow Up  Subjective:    Patient ID: Molly Rodriguez, female    DOB: 1955/08/17, 62 y.o.   MRN: [id number]   Chief Complaint  Patient presents with  . Follow-up    HTN   HPI  Molly Rodriguez has a past medical history of Hypertension and Hypercholesteremia.   Current Status: Since her last office visit, she is doing well with no complaints. She denies fevers, chills, fatigue, recent infections, weight loss, and night sweats. She has not had any headaches, visual changes, dizziness, and falls. No chest pain, heart palpitations, cough and shortness of breath reported. No reports of GI problems such as nausea, vomiting, diarrhea, and constipation. She has no reports of blood in stools, dysuria and hematuria. No depression or anxiety. She denies pain today.   Past Medical History:  Diagnosis Date  . Hypercholesteremia   . Hypertension     History reviewed. No pertinent family history.  Social History   Socioeconomic History  . Marital status: Single    Spouse name: Not on file  . Number of children: Not on file  . Years of education: Not on file  . Highest education level: Not on file  Occupational History  . Not on file  Social Needs  . Financial resource strain: Not on file  . Food insecurity:    Worry: Not on file    Inability: Not on file  . Transportation needs:    Medical: Not on file    Non-medical: Not on file  Tobacco Use  . Smoking status: Never Smoker  . Smokeless tobacco: Never Used  Substance and Sexual Activity  . Alcohol use: Yes    Alcohol/week: 1.2 oz    Types: 2 Cans of beer per week    Comment: daily  . Drug use: No  . Sexual activity: Yes    Birth control/protection: Post-menopausal  Lifestyle  . Physical activity:    Days per week: Not on file    Minutes per session: Not on file  . Stress: Not on file  Relationships  . Social connections:    Talks on phone: Not on file    Gets together: Not on file    Attends religious service: Not on file    Active  member of club or organization: Not on file    Attends meetings of clubs or organizations: Not on file    Relationship status: Not on file  . Intimate partner violence:    Fear of current or ex partner: Not on file    Emotionally abused: Not on file    Physically abused: Not on file    Forced sexual activity: Not on file  Other Topics Concern  . Not on file  Social History Narrative  . Not on file    History reviewed. No pertinent surgical history.   Immunization History  Administered Date(s) Administered  . Influenza Whole 11/06/2010  . Influenza,inj,Quad PF,6+ Mos 11/14/2015, 12/24/2016  . Tdap 08/23/2016    Current Meds  Medication Sig  . amLODipine (NORVASC) 10 MG tablet TAKE 1 TABLET(10 MG) BY MOUTH DAILY  . metoprolol succinate (TOPROL-XL) 25 MG 24 hr tablet TAKE 2 TABLETS(50 MG) BY MOUTH DAILY   No Known Allergies  BP 140/72 (BP Location: Left Arm, Patient Position: Sitting, Cuff Size: Small)   Pulse 88   Temp 98.8 F (37.1 C) (Oral)   Ht 5' (1.524 m)   Wt 109 lb (49.4 kg)   SpO2 100%   BMI 21.29 kg/m  Review of Systems  Constitutional: Negative.   HENT: Negative.   Eyes: Negative.   Respiratory: Negative.   Cardiovascular: Negative.   Gastrointestinal: Negative.   Endocrine: Negative.   Genitourinary: Negative.   Musculoskeletal: Negative.   Skin: Negative.   Allergic/Immunologic: Negative.   Neurological: Negative.   Hematological: Negative.   Psychiatric/Behavioral: Negative.    Objective:   Physical Exam  Constitutional: She is oriented to person, place, and time. She appears well-developed and well-nourished.  HENT:  Head: Normocephalic and atraumatic.  Right Ear: External ear normal.  Left Ear: External ear normal.  Nose: Nose normal.  Mouth/Throat: Oropharynx is clear and moist.  Eyes: Pupils are equal, round, and reactive to light. Conjunctivae and EOM are normal.  Neck: Neck supple.  Cardiovascular: Normal rate, regular rhythm,  normal heart sounds and intact distal pulses.  Pulmonary/Chest: Effort normal and breath sounds normal.  Abdominal: Soft. Bowel sounds are normal.  Musculoskeletal: Normal range of motion.  Neurological: She is alert and oriented to person, place, and time.  Skin: Skin is warm and dry. Capillary refill takes less than 2 seconds.  Psychiatric: She has a normal mood and affect. Her behavior is normal. Judgment and thought content normal.  Nursing note and vitals reviewed.  Assessment & Plan:   1. Essential hypertension Blood pressure is 140/72 today. She will continue Amlodipine, Metoprolol, and Lisinopril as prescribed. She will continue to decrease high sodium intake, excessive alcohol intake, increase potassium intake, smoking cessation, and increase physical activity of at least 30 minutes of cardio activity daily. She will continue to follow Heart Healthy or DASH diet. - POCT urinalysis dipstick  2. Screening for diabetes mellitus Hgb A1c is stable at 5.7. She will continue to decrease foods/beverages high in sugars and carbs and follow Heart Healthy or DASH diet. Increase physical activity to at least 30 minutes cardio exercise daily.   - POCT glycosylated hemoglobin (Hb A1C)  3. Follow up She will follow up in 3 months.   Meds ordered this encounter  Medications  . lisinopril (PRINIVIL,ZESTRIL) 30 MG tablet    Sig: Take 1 tablet (30 mg total) by mouth daily.    Dispense:  90 tablet    Refill:  1   Raliegh IpNatalie Saafir Abdullah,  MSN, FNP-C Patient [facility] [address]  [place], [place] [id number] [phone]

## 2017-12-02 ENCOUNTER — Other Ambulatory Visit: Payer: Self-pay | Admitting: Family Medicine

## 2017-12-24 ENCOUNTER — Telehealth: Payer: Self-pay

## 2017-12-24 NOTE — Telephone Encounter (Signed)
Called, no answer. Left a message for patient to remind of appointment for 12/25/2017 and if unable to come to please call and reschedule. Thanks!

## 2017-12-25 ENCOUNTER — Ambulatory Visit (INDEPENDENT_AMBULATORY_CARE_PROVIDER_SITE_OTHER): Payer: Self-pay | Admitting: Family Medicine

## 2017-12-25 ENCOUNTER — Encounter: Payer: Self-pay | Admitting: Family Medicine

## 2017-12-25 VITALS — BP 146/90 | HR 84 | Temp 98.9°F | Ht 60.0 in | Wt 113.0 lb

## 2017-12-25 DIAGNOSIS — R21 Rash and other nonspecific skin eruption: Secondary | ICD-10-CM

## 2017-12-25 DIAGNOSIS — Z09 Encounter for follow-up examination after completed treatment for conditions other than malignant neoplasm: Secondary | ICD-10-CM

## 2017-12-25 DIAGNOSIS — Z23 Encounter for immunization: Secondary | ICD-10-CM

## 2017-12-25 DIAGNOSIS — I10 Essential (primary) hypertension: Secondary | ICD-10-CM

## 2017-12-25 DIAGNOSIS — J302 Other seasonal allergic rhinitis: Secondary | ICD-10-CM

## 2017-12-25 LAB — POCT URINALYSIS DIP (MANUAL ENTRY)
Bilirubin, UA: NEGATIVE
Blood, UA: NEGATIVE
Glucose, UA: NEGATIVE mg/dL
Ketones, POC UA: NEGATIVE mg/dL
Leukocytes, UA: NEGATIVE
Nitrite, UA: NEGATIVE
Protein Ur, POC: NEGATIVE mg/dL
Spec Grav, UA: 1.01 (ref 1.010–1.025)
Urobilinogen, UA: 0.2 E.U./dL
pH, UA: 7 (ref 5.0–8.0)

## 2017-12-25 MED ORDER — METOPROLOL SUCCINATE ER 50 MG PO TB24: 50.0000 mg | ORAL_TABLET | Freq: Every day | ORAL | 2 refills | Status: DC

## 2017-12-25 MED ORDER — LISINOPRIL 30 MG PO TABS: 30.0000 mg | ORAL_TABLET | Freq: Every day | ORAL | 2 refills | Status: DC

## 2017-12-25 MED ORDER — AMLODIPINE BESYLATE 10 MG PO TABS: ORAL_TABLET | ORAL | 2 refills | Status: DC

## 2017-12-25 NOTE — Progress Notes (Signed)
Follow Up  Subjective:    Patient ID: Molly Rodriguez, female    DOB: Dec 08, 1955, 62 y.o.   MRN: [id number]  Chief Complaint  Patient presents with  . Follow-up    htn   HPI  Molly Rodriguez is a 62 year old female with a past medical history of Hypertension and Hypercholesteremia. She is here today for follow up of chronic diseases.   Current Status: Since her last office visit, she is doing well with no complaints. She has been out of her hypertensive medications for a few weeks now. She denies visual changes, chest pain, cough, shortness of breath, heart palpitations, and falls. She has occasionally headaches and dizziness with position changes. Denies severe headaches, confusion, seizures, double vision, and blurred vision, nausea and vomiting.  She denies fevers, chills, fatigue, recent infections, weight loss, and night sweats. She has not had any headaches, visual changes, dizziness, and falls. No chest pain, heart palpitations, cough and shortness of breath reported. No reports of GI problems such as nausea, vomiting, diarrhea, and constipation. She has no reports of blood in stools, dysuria and hematuria. No depression or anxiety, and denies suicidal ideations, homicidal ideations, or auditory hallucinations. She denies pain today.     Past Medical History:  Diagnosis Date  . Hypercholesteremia   . Hypertension     History reviewed. No pertinent family history.  Social History   Socioeconomic History  . Marital status: Single    Spouse name: Not on file  . Number of children: Not on file  . Years of education: Not on file  . Highest education level: Not on file  Occupational History  . Not on file  Social Needs  . Financial resource strain: Not on file  . Food insecurity:    Worry: Not on file    Inability: Not on file  . Transportation needs:    Medical: Not on file    Non-medical: Not on file  Tobacco Use  . Smoking status: Never Smoker  . Smokeless tobacco: Never Used   Substance and Sexual Activity  . Alcohol use: Yes    Alcohol/week: 2.0 standard drinks    Types: 2 Cans of beer per week    Comment: daily  . Drug use: No  . Sexual activity: Yes    Birth control/protection: Post-menopausal  Lifestyle  . Physical activity:    Days per week: Not on file    Minutes per session: Not on file  . Stress: Not on file  Relationships  . Social connections:    Talks on phone: Not on file    Gets together: Not on file    Attends religious service: Not on file    Active member of club or organization: Not on file    Attends meetings of clubs or organizations: Not on file    Relationship status: Not on file  . Intimate partner violence:    Fear of current or ex partner: Not on file    Emotionally abused: Not on file    Physically abused: Not on file    Forced sexual activity: Not on file  Other Topics Concern  . Not on file  Social History Narrative  . Not on file    History reviewed. No pertinent surgical history.  Immunization History  Administered Date(s) Administered  . Influenza Whole 11/06/2010  . Influenza,inj,Quad PF,6+ Mos 11/14/2015, 12/24/2016, 12/25/2017  . Tdap 08/23/2016    Current Meds  Medication Sig  . amLODipine (NORVASC) 10 MG tablet  TAKE 1 TABLET(10 MG) BY MOUTH DAILY  . lisinopril (PRINIVIL,ZESTRIL) 30 MG tablet Take 1 tablet (30 mg total) by mouth daily.  . [DISCONTINUED] amLODipine (NORVASC) 10 MG tablet TAKE 1 TABLET(10 MG) BY MOUTH DAILY  . [DISCONTINUED] lisinopril (PRINIVIL,ZESTRIL) 30 MG tablet Take 1 tablet (30 mg total) by mouth daily.  . [DISCONTINUED] metoprolol succinate (TOPROL-XL) 25 MG 24 hr tablet TAKE 2 TABLETS(50 MG) BY MOUTH DAILY   No Known Allergies  BP (!) 146/90   Pulse 84   Temp 98.9 F (37.2 C) (Oral)   Ht 5' (1.524 m)   Wt 113 lb (51.3 kg)   SpO2 99%   BMI 22.07 kg/m      Review of Systems  Constitutional: Negative.   HENT: Negative.   Respiratory: Negative.   Cardiovascular:  Negative.   Gastrointestinal: Negative.   Musculoskeletal: Positive for arthralgias (Generalized).  Skin: Negative.   Neurological: Positive for dizziness and headaches.  Psychiatric/Behavioral: Negative.    Objective:   Physical Exam  Constitutional: She is oriented to person, place, and time. She appears well-developed and well-nourished.  HENT:  Head: Normocephalic and atraumatic.  Cardiovascular: Normal rate, regular rhythm, normal heart sounds and intact distal pulses.  Pulmonary/Chest: Effort normal and breath sounds normal.  Abdominal: Soft. Bowel sounds are normal.  Musculoskeletal: Normal range of motion.  Neurological: She is alert and oriented to person, place, and time.  Skin: Skin is warm and dry.  Psychiatric: She has a normal mood and affect. Her behavior is normal. Judgment and thought content normal.  Nursing note and vitals reviewed.  Assessment & Plan:   1. Essential hypertension Blood pressure is elevated today. We will refill all medications today. She will re-start taking all antihypertensive medications daily as prescribed. She will continue to decrease high sodium intake, excessive alcohol intake, increase potassium intake, smoking cessation, and increase physical activity of at least 30 minutes of cardio activity daily. She will continue to follow Heart Healthy or DASH diet. - POCT urinalysis dipstick - amLODipine (NORVASC) 10 MG tablet; TAKE 1 TABLET(10 MG) BY MOUTH DAILY  Dispense: 90 tablet; Refill: 2 - lisinopril (PRINIVIL,ZESTRIL) 30 MG tablet; Take 1 tablet (30 mg total) by mouth daily.  Dispense: 90 tablet; Refill: 2 - metoprolol succinate (TOPROL-XL) 50 MG 24 hr tablet; Take 1 tablet (50 mg total) by mouth daily. Take with or immediately following a meal.  Dispense: 90 tablet; Refill: 2  2. Seasonal allergies - hydrocortisone cream 0.5 %; Apply 1 application topically 2 (two) times daily.  Dispense: 30 g; Refill: 3 - cetirizine (ZYRTEC) 10 MG tablet;  Take 1 tablet (10 mg total) by mouth daily.  Dispense: 30 tablet; Refill: 11  3. Skin rash We will initiate treatment for allergies.  - hydrocortisone cream 0.5 %; Apply 1 application topically 2 (two) times daily.  Dispense: 30 g; Refill: 3 - cetirizine (ZYRTEC) 10 MG tablet; Take 1 tablet (10 mg total) by mouth daily.  Dispense: 30 tablet; Refill: 11  4. Need for immunization against influenza - Flu Vaccine QUAD 36+ mos IM  5. Follow up She will follow up in 1 month for blood pressure check. She will follow up in 6 months.   Meds ordered this encounter  Medications  . amLODipine (NORVASC) 10 MG tablet    Sig: TAKE 1 TABLET(10 MG) BY MOUTH DAILY    Dispense:  90 tablet    Refill:  2  . lisinopril (PRINIVIL,ZESTRIL) 30 MG tablet    Sig: Take 1  tablet (30 mg total) by mouth daily.    Dispense:  90 tablet    Refill:  2  . metoprolol succinate (TOPROL-XL) 50 MG 24 hr tablet    Sig: Take 1 tablet (50 mg total) by mouth daily. Take with or immediately following a meal.    Dispense:  90 tablet    Refill:  2  . hydrocortisone cream 0.5 %    Sig: Apply 1 application topically 2 (two) times daily.    Dispense:  30 g    Refill:  3  . cetirizine (ZYRTEC) 10 MG tablet    Sig: Take 1 tablet (10 mg total) by mouth daily.    Dispense:  30 tablet    Refill:  11   Raliegh Ip,  MSN, [facility] Patient [facility] [address] [place], [place] [address] [phone]

## 2017-12-25 NOTE — Patient Instructions (Addendum)
Rash A rash is a change in the color of the skin. A rash can also change the way your skin feels. There are many different conditions and factors that can cause a rash. Follow these instructions at home: Pay attention to any changes in your symptoms. Follow these instructions to help with your condition: Medicine Take or apply over-the-counter and prescription medicines only as told by your doctor. These may include:  Corticosteroid cream.  Anti-itch lotions.  Oral antihistamines.  Skin Care  Put cool compresses on the affected areas.  Try taking a bath with: ? Epsom salts. Follow the instructions on the packaging. You can get these at your local pharmacy or grocery store. ? Baking soda. Pour a small amount into the bath as told by your doctor. ? Colloidal oatmeal. Follow the instructions on the packaging. You can get this at your local pharmacy or grocery store.  Try putting baking soda paste onto your skin. Stir water into baking soda until it gets like a paste.  Do not scratch or rub your skin.  Avoid covering the rash. Make sure the rash is exposed to air as much as possible. General instructions  Avoid hot showers or baths, which can make itching worse. A cold shower may help.  Avoid scented soaps, detergents, and perfumes. Use gentle soaps, detergents, perfumes, and other cosmetic products.  Avoid anything that causes your rash. Keep a journal to help track what causes your rash. Write down: ? What you eat. ? What cosmetic products you use. ? What you drink. ? What you wear. This includes jewelry.  Keep all follow-up visits as told by your doctor. This is important. Contact a doctor if:  You sweat at night.  You lose weight.  You pee (urinate) more than normal.  You feel weak.  You throw up (vomit).  Your skin or the whites of your eyes look yellow (jaundice).  Your skin: ? Tingles. ? Is numb.  Your rash: ? Does not go away after a few days. ? Gets  worse.  You are: ? More thirsty than normal. ? More tired than normal.  You have: ? New symptoms. ? Pain in your belly (abdomen). ? A fever. ? Watery poop (diarrhea). Get help right away if:  Your rash covers all or most of your body. The rash may or may not be painful.  You have blisters that: ? Are on top of the rash. ? Grow larger. ? Grow together. ? Are painful. ? Are inside your nose or mouth.  You have a rash that: ? Looks like purple pinprick-sized spots all over your body. ? Has a "bull's eye" or looks like a target. ? Is red and painful, causes your skin to peel, and is not from being in the sun too long. This information is not intended to replace advice given to you by your health care provider. Make sure you discuss any questions you have with your health care provider. Document Released: 08/01/2007 Document Revised: 07/21/2015 Document Reviewed: 06/30/2014 Elsevier Interactive Patient Education  2018 [employer]. Allergies An allergy is when your body reacts to a substance in a way that is not normal. An allergic reaction can happen after you:  Eat something.  Breathe in something.  Touch something.  You can be allergic to:  Things that are only around during certain seasons, like molds and pollens.  Foods.  Drugs.  Insects.  Animal dander.  What are the signs or symptoms?  Puffiness (swelling). This may  happen on the lips, face, tongue, mouth, or throat.  Sneezing.  Coughing.  Breathing loudly (wheezing).  Stuffy nose.  Tingling in the mouth.  A rash.  Itching.  Itchy, red, puffy areas of skin (hives).  Watery eyes.  Throwing up (vomiting).  Watery poop (diarrhea).  Dizziness.  Feeling faint or fainting.  Trouble breathing or swallowing.  A tight feeling in the chest.  A fast heartbeat. How is this diagnosed? Allergies can be diagnosed with:  A medical and family history.  Skin tests.  Blood tests.  A food  diary. A food diary is a record of all the foods, drinks, and symptoms you have each day.  The results of an elimination diet. This diet involves making sure not to eat certain foods and then seeing what happens when you start eating them again.  How is this treated? There is no cure for allergies, but allergic reactions can be treated with medicine. Severe reactions usually need to be treated at a hospital. How is this prevented? The best way to prevent an allergic reaction is to avoid the thing you are allergic to. Allergy shots and medicines can also help prevent reactions in some cases. This information is not intended to replace advice given to you by your health care provider. Make sure you discuss any questions you have with your health care provider. Document Released: 06/09/2012 Document Revised: 10/10/2015 Document Reviewed: 11/24/2013 Elsevier Interactive Patient Education  2018 [employer]. Hydrocortisone skin cream, ointment, lotion, or solution What is this medicine? HYDROCORTISONE (hye droe KOR ti sone) is a corticosteroid. It is used on the skin to reduce swelling, redness, itching, and allergic reactions. This medicine may be used for other purposes; ask your health care provider or pharmacist if you have questions. COMMON BRAND NAME(S): Ala-Cort, Ala-Scalp, Anusol HC, Aqua Glycolic HC, Balneol for Her, Caldecort, Cetacort, Cortaid, Cortaid Advanced, Cortaid Intensive Therapy, Cortaid Sensitive Skin, CortAlo, Corticaine, Corticool, Cortizone, Cortizone-10, Cortizone-10 Cooling Relief, Cortizone-10 Intensive Healing, Cortizone-10 Plus, Dermarest Dricort, Dermarest Eczema, DERMASORB HC Complete, Gly-Cort, Hycort, Hydro Skin, Hydroskin, Hytone, Instacort, Lacticare HC, Locoid, Locoid Lipocream, MiCort-HC, Monistat Complete Care Instant Itch Relief Cream, Neosporin Eczema, NuCort, Nutracort, NuZon, Pandel, Pediaderm HC, Penecort, Preparation H Hydrocortisone, Procto-Kit, Procto-Med HC,  Proctocream-HC, Proctosol-HC, Proctozone-HC, Rederm, Sarnol-HC, Nurse, adult, Engineer, site, Contractor, Tucks HC, Human resources officer What should I tell my health care provider before I take this medicine? They need to know if you have any of these conditions: -any active infection -diabetes -large areas of burned or damaged skin -skin wasting or thinning -an unusual or allergic reaction to hydrocortisone, corticosteroids, sulfites, other medicines, foods, dyes, or preservatives -pregnant or trying to get pregnant -breast-feeding How should I use this medicine? This medicine is for external use only. Do not take by mouth. Follow the directions on the prescription label. Wash your hands before and after use. Apply a thin film of medicine to the affected area. Do not cover with a bandage or dressing unless your doctor or health care professional tells you to. Do not use on healthy skin or over large areas of skin. Do not get this medicine in your eyes. If you do, rinse out with plenty of cool tap water. Do not to use more medicine than prescribed. Do not use your medicine more often than directed or for more than 14 days. Talk to your pediatrician regarding the use of this medicine in children. Special care may be needed. While this drug may be prescribed for children as young as 2  years of age for selected conditions, precautions do apply. Do not use this medicine for the treatment of diaper rash unless directed to do so by your doctor or health care professional. If applying this medicine to the diaper area of a child, do not cover with tight-fitting diapers or plastic pants. This may increase the amount of medicine that passes through the skin and increase the risk of serious side effects. Elderly patients are more likely to have damaged skin through aging, and this may increase side effects. This medicine should only be used for brief periods and infrequently in older patients. Overdosage: If you think you  have taken too much of this medicine contact a poison control center or emergency room at once. NOTE: This medicine is only for you. Do not share this medicine with others. What if I miss a dose? If you miss a dose, use it as soon as you can. If it is almost time for your next dose, use only that dose. Do not use double or extra doses. What may interact with this medicine? Interactions are not expected. Do not use any other skin products on the affected area without asking your doctor or health care professional. This list may not describe all possible interactions. Give your health care provider a list of all the medicines, herbs, non-prescription drugs, or dietary supplements you use. Also tell them if you smoke, drink alcohol, or use illegal drugs. Some items may interact with your medicine. What should I watch for while using this medicine? Tell your doctor or health care professional if your symptoms do not start to get better within 7 days or if they get worse. Tell your doctor or health care professional if you are exposed to anyone with measles or chickenpox, or if you develop sores or blisters that do not heal properly. What side effects may I notice from receiving this medicine? Side effects that you should report to your doctor or health care professional as soon as possible: -allergic reactions like skin rash, itching or hives, swelling of the face, lips, or tongue -burning feeling on the skin -dark red spots on the skin -infection -lack of healing of skin condition -painful, red, pus filled blisters in hair follicles -thinning of the skin Side effects that usually do not require medical attention (report to your doctor or health care professional if they continue or are bothersome): -dry skin, irritation -unusual increased growth of hair on the face or body This list may not describe all possible side effects. Call your doctor for medical advice about side effects. You may report  side effects to FDA at 1-800-FDA-1088. Where should I keep my medicine? Keep out of the reach of children. Store at room temperature between 15 and 30 degrees C (59 and 86 degrees F). Do not freeze. Throw away any unused medicine after the expiration date. NOTE: This sheet is a summary. It may not cover all possible information. If you have questions about this medicine, talk to your doctor, pharmacist, or health care provider.  2018 Elsevier/Gold Standard (2007-06-27 16:08:48) Cetirizine tablets What is this medicine? CETIRIZINE (se TI ra zeen) is an antihistamine. This medicine is used to treat or prevent symptoms of allergies. It is also used to help reduce itchy skin rash and hives. This medicine may be used for other purposes; ask your health care provider or pharmacist if you have questions. COMMON BRAND NAME(S): All Day Allergy, Zyrtec, Zyrtec Hives Relief What should I tell my health care provider  before I take this medicine? They need to know if you have any of these conditions: -kidney disease -liver disease -an unusual or allergic reaction to cetirizine, hydroxyzine, other medicines, foods, dyes, or preservatives -pregnant or trying to get pregnant -breast-feeding How should I use this medicine? Take this medicine by mouth with a glass of water. Follow the directions on the prescription label. You can take this medicine with food or on an empty stomach. Take your medicine at regular times. Do not take more often than directed. You may need to take this medicine for several days before your symptoms improve. Talk to your pediatrician regarding the use of this medicine in children. Special care may be needed. While this drug may be prescribed for children as young as 32 years of age for selected conditions, precautions do apply. Overdosage: If you think you have taken too much of this medicine contact a poison control center or emergency room at once. NOTE: This medicine is only for  you. Do not share this medicine with others. What if I miss a dose? If you miss a dose, take it as soon as you can. If it is almost time for your next dose, take only that dose. Do not take double or extra doses. What may interact with this medicine? -alcohol -certain medicines for anxiety or sleep -narcotic medicines for pain -other medicines for colds or allergies This list may not describe all possible interactions. Give your health care provider a list of all the medicines, herbs, non-prescription drugs, or dietary supplements you use. Also tell them if you smoke, drink alcohol, or use illegal drugs. Some items may interact with your medicine. What should I watch for while using this medicine? Visit your doctor or health care professional for regular checks on your health. Tell your doctor if your symptoms do not improve. You may get drowsy or dizzy. Do not drive, use machinery, or do anything that needs mental alertness until you know how this medicine affects you. Do not stand or sit up quickly, especially if you are an older patient. This reduces the risk of dizzy or fainting spells. Your mouth may get dry. Chewing sugarless gum or sucking hard candy, and drinking plenty of water may help. Contact your doctor if the problem does not go away or is severe. What side effects may I notice from receiving this medicine? Side effects that you should report to your doctor or health care professional as soon as possible: -allergic reactions like skin rash, itching or hives, swelling of the face, lips, or tongue -changes in vision or hearing -fast or irregular heartbeat -trouble passing urine or change in the amount of urine Side effects that usually do not require medical attention (report to your doctor or health care professional if they continue or are bothersome): -dizziness -dry mouth -irritability -sore throat -stomach pain -tiredness This list may not describe all possible side  effects. Call your doctor for medical advice about side effects. You may report side effects to FDA at 1-800-FDA-1088. Where should I keep my medicine? Keep out of the reach of children. Store at room temperature between 15 and 30 degrees C (59 and 86 degrees F). Throw away any unused medicine after the expiration date. NOTE: This sheet is a summary. It may not cover all possible information. If you have questions about this medicine, talk to your doctor, pharmacist, or health care provider.  2018 Elsevier/Gold Standard (2014-03-09 13:44:42)

## 2017-12-26 MED ORDER — HYDROCORTISONE 0.5 % EX CREA: 1.0000 "application " | TOPICAL_CREAM | Freq: Two times a day (BID) | CUTANEOUS | 3 refills | Status: DC

## 2017-12-26 MED ORDER — CETIRIZINE HCL 10 MG PO TABS: 10.0000 mg | ORAL_TABLET | Freq: Every day | ORAL | 11 refills | Status: DC

## 2018-01-27 ENCOUNTER — Ambulatory Visit: Payer: Self-pay | Admitting: Family Medicine

## 2018-01-27 VITALS — BP 140/80 | HR 80 | Temp 98.0°F | Ht 61.0 in | Wt 113.0 lb

## 2018-01-27 DIAGNOSIS — Z0130 Encounter for examination of blood pressure without abnormal findings: Secondary | ICD-10-CM

## 2018-01-27 NOTE — Progress Notes (Signed)
Patient advise to continue her medication and to keep scheduled follow up.

## 2018-03-17 ENCOUNTER — Other Ambulatory Visit: Payer: Self-pay | Admitting: Family Medicine

## 2018-03-17 DIAGNOSIS — I10 Essential (primary) hypertension: Secondary | ICD-10-CM

## 2018-06-27 ENCOUNTER — Ambulatory Visit: Payer: Self-pay | Admitting: Family Medicine

## 2018-09-10 ENCOUNTER — Other Ambulatory Visit: Payer: Self-pay | Admitting: Family Medicine

## 2018-09-10 ENCOUNTER — Ambulatory Visit: Payer: Self-pay | Admitting: Family Medicine

## 2018-09-10 DIAGNOSIS — I10 Essential (primary) hypertension: Secondary | ICD-10-CM

## 2018-09-13 ENCOUNTER — Other Ambulatory Visit: Payer: Self-pay | Admitting: Family Medicine

## 2018-09-13 DIAGNOSIS — I10 Essential (primary) hypertension: Secondary | ICD-10-CM

## 2018-09-17 ENCOUNTER — Ambulatory Visit: Payer: Self-pay | Admitting: Family Medicine

## 2018-10-01 ENCOUNTER — Encounter: Payer: Self-pay | Admitting: Family Medicine

## 2018-10-01 ENCOUNTER — Ambulatory Visit (INDEPENDENT_AMBULATORY_CARE_PROVIDER_SITE_OTHER): Payer: Self-pay | Admitting: Family Medicine

## 2018-10-01 ENCOUNTER — Other Ambulatory Visit: Payer: Self-pay

## 2018-10-01 VITALS — BP 140/72 | HR 96 | Temp 98.1°F | Ht 61.0 in | Wt 112.0 lb

## 2018-10-01 DIAGNOSIS — R21 Rash and other nonspecific skin eruption: Secondary | ICD-10-CM | POA: Insufficient documentation

## 2018-10-01 DIAGNOSIS — J302 Other seasonal allergic rhinitis: Secondary | ICD-10-CM | POA: Insufficient documentation

## 2018-10-01 DIAGNOSIS — Z09 Encounter for follow-up examination after completed treatment for conditions other than malignant neoplasm: Secondary | ICD-10-CM

## 2018-10-01 DIAGNOSIS — Z131 Encounter for screening for diabetes mellitus: Secondary | ICD-10-CM

## 2018-10-01 DIAGNOSIS — Z758 Other problems related to medical facilities and other health care: Secondary | ICD-10-CM

## 2018-10-01 DIAGNOSIS — I10 Essential (primary) hypertension: Secondary | ICD-10-CM

## 2018-10-01 DIAGNOSIS — Z1231 Encounter for screening mammogram for malignant neoplasm of breast: Secondary | ICD-10-CM

## 2018-10-01 DIAGNOSIS — Z789 Other specified health status: Secondary | ICD-10-CM

## 2018-10-01 LAB — POCT URINALYSIS DIP (MANUAL ENTRY)
Bilirubin, UA: NEGATIVE
Glucose, UA: NEGATIVE mg/dL
Ketones, POC UA: NEGATIVE mg/dL
Leukocytes, UA: NEGATIVE
Nitrite, UA: NEGATIVE
Protein Ur, POC: NEGATIVE mg/dL
Spec Grav, UA: <=1.005 — AB (ref 1.010–1.025)
Urobilinogen, UA: 0.2 E.U./dL
pH, UA: 6.5 (ref 5.0–8.0)

## 2018-10-01 LAB — POCT GLYCOSYLATED HEMOGLOBIN (HGB A1C): Hemoglobin A1C: 6 % — AB (ref 4.0–5.6)

## 2018-10-01 MED ORDER — HYDROCORTISONE 0.5 % EX CREA: 1.0000 "application " | TOPICAL_CREAM | Freq: Two times a day (BID) | CUTANEOUS | 3 refills | Status: DC

## 2018-10-01 MED ORDER — METOPROLOL SUCCINATE ER 50 MG PO TB24: ORAL_TABLET | ORAL | 2 refills | Status: DC

## 2018-10-01 MED ORDER — AMLODIPINE BESYLATE 10 MG PO TABS: 10.0000 mg | ORAL_TABLET | Freq: Every day | ORAL | 2 refills | Status: DC

## 2018-10-01 MED ORDER — LISINOPRIL 30 MG PO TABS: 30.0000 mg | ORAL_TABLET | Freq: Every day | ORAL | 2 refills | Status: DC

## 2018-10-01 NOTE — Progress Notes (Signed)
[facility]   Established Patient Office Visit  Subjective:  Patient ID: Molly Rodriguez, female    DOB: 03/09/1955  Age: 63 y.o. MRN: [id number]  CC:  Chief Complaint  Patient presents with  . Follow-up    HTN     HPI Molly Rodriguez is a 63 year old female who presents for follow up.  Past Medical History:  Diagnosis Date  . Hypercholesteremia   . Hypertension    Current Status: Since her last office visit, she is doing well with no complaints. She denies visual changes, chest pain, cough, shortness of breath, heart palpitations, and falls. She has occasional headaches and dizziness with position changes. Denies severe headaches, confusion, seizures, double vision, and blurred vision, nausea and vomiting. Her daughter-in-law is here today to assist in communication. Patient is originally from [place].   She denies fevers, chills, fatigue, recent infections, weight loss, and night sweats. She has not had any falls. No chest pain, heart palpitations, cough and shortness of breath reported. No reports of GI problems such as  diarrhea, and constipation. She has no reports of blood in stools, dysuria and hematuria. No depression or anxiety reported. She denies pain today.   History reviewed. No pertinent surgical history.  History reviewed. No pertinent family history.  Social History   Socioeconomic History  . Marital status: Single    Spouse name: Not on file  . Number of children: Not on file  . Years of education: Not on file  . Highest education level: Not on file  Occupational History  . Not on file  Social Needs  . Financial resource strain: Not on file  . Food insecurity    Worry: Not on file    Inability: Not on file  . Transportation needs    Medical: Not on file    Non-medical: Not on file  Tobacco Use  . Smoking status: Never Smoker  . Smokeless tobacco: Never Used  Substance and Sexual Activity  . Alcohol use: Yes     Alcohol/week: 2.0 standard drinks    Types: 2 Cans of beer per week    Comment: daily  . Drug use: No  . Sexual activity: Yes    Birth control/protection: Post-menopausal  Lifestyle  . Physical activity    Days per week: Not on file    Minutes per session: Not on file  . Stress: Not on file  Relationships  . Social Musicianconnections    Talks on phone: Not on file    Gets together: Not on file    Attends religious service: Not on file    Active member of club or organization: Not on file    Attends meetings of clubs or organizations: Not on file    Relationship status: Not on file  . Intimate partner violence    Fear of current or ex partner: Not on file    Emotionally abused: Not on file    Physically abused: Not on file    Forced sexual activity: Not on file  Other Topics Concern  . Not on file  Social History Narrative  . Not on file    Outpatient Medications Prior to Visit  Medication Sig Dispense Refill  . amLODipine (NORVASC) 10 MG tablet TAKE 1 TABLET(10 MG) BY MOUTH DAILY 20 tablet 0  . hydrocortisone cream 0.5 % Apply 1 application topically 2 (two) times daily. 30 g 3  . lisinopril (PRINIVIL,ZESTRIL) 30 MG tablet Take 1  tablet (30 mg total) by mouth daily. 90 tablet 2  . metoprolol succinate (TOPROL-XL) 50 MG 24 hr tablet TAKE 1 TABLET BY MOUTH DAILY, TAKE WITH OR FOLLOWING A MEAL 30 tablet 0  . cetirizine (ZYRTEC) 10 MG tablet Take 1 tablet (10 mg total) by mouth daily. (Patient not taking: Reported on 10/01/2018) 30 tablet 11   No facility-administered medications prior to visit.     No Known Allergies  ROS Review of Systems  Constitutional: Negative.   HENT: Negative.   Eyes: Negative.   Respiratory: Negative.   Cardiovascular: Negative.   Gastrointestinal: Negative.   Endocrine: Negative.   Genitourinary: Negative.   Musculoskeletal: Negative.   Skin: Negative.   Allergic/Immunologic: Negative.   Neurological: Positive for dizziness (occasional) and  headaches (occasional).  Hematological: Negative.   Psychiatric/Behavioral: Negative.       Objective:    Physical Exam  Constitutional: She is oriented to person, place, and time. She appears well-developed and well-nourished.  HENT:  Head: Normocephalic and atraumatic.  Eyes: Conjunctivae are normal.  Neck: Normal range of motion. Neck supple.  Cardiovascular: Normal rate, regular rhythm, normal heart sounds and intact distal pulses.  Pulmonary/Chest: Effort normal and breath sounds normal.  Abdominal: Soft. Bowel sounds are normal.  Musculoskeletal: Normal range of motion.  Neurological: She is alert and oriented to person, place, and time.  Skin: Skin is warm and dry.  Psychiatric: She has a normal mood and affect. Her behavior is normal. Judgment and thought content normal.  Nursing note and vitals reviewed.   BP 140/72   Pulse 96   Temp 98.1 F (36.7 C) (Oral)   Ht 5\' 1"  (1.549 m)   Wt 112 lb (50.8 kg)   SpO2 100%   BMI 21.16 kg/m  Wt Readings from Last 3 Encounters:  10/01/18 112 lb (50.8 kg)  01/27/18 113 lb (51.3 kg)  12/25/17 113 lb (51.3 kg)     Health Maintenance Due  Topic Date Due  . MAMMOGRAM  06/28/2005  . COLONOSCOPY  06/28/2005  . INFLUENZA VACCINE  09/27/2018    There are no preventive care reminders to display for this patient.  Lab Results  Component Value Date   TSH 1.27 07/09/2016   Lab Results  Component Value Date   WBC 6.6 07/09/2016   HGB 12.1 07/09/2016   HCT 37.7 07/09/2016   MCV 80.2 07/09/2016   PLT 256 07/09/2016   Lab Results  Component Value Date   NA 139 06/24/2017   K 4.0 06/24/2017   CO2 19 (L) 06/24/2017   GLUCOSE 97 06/24/2017   BUN 10 06/24/2017   CREATININE 0.70 06/24/2017   BILITOT 0.6 12/24/2016   ALKPHOS 49 07/09/2016   AST 18 12/24/2016   ALT 16 12/24/2016   PROT 7.6 12/24/2016   ALBUMIN 4.5 07/09/2016   CALCIUM 9.4 06/24/2017   ANIONGAP 9 12/17/2015   Lab Results  Component Value Date   CHOL  193 07/09/2016   Lab Results  Component Value Date   HDL 93 07/09/2016   Lab Results  Component Value Date   LDLCALC 67 07/09/2016   Lab Results  Component Value Date   TRIG 166 (H) 07/09/2016   Lab Results  Component Value Date   CHOLHDL 2.1 07/09/2016   Lab Results  Component Value Date   HGBA1C 6.0 (A) 10/01/2018      Assessment & Plan:   1. Essential hypertension The current medical regimen is effective; blood pressure is stable at 140/72  today; continue present plan and medications as prescribed. She will continue to decrease high sodium intake, excessive alcohol intake, increase potassium intake, smoking cessation, and increase physical activity of at least 30 minutes of cardio activity daily. She will continue to follow Heart Healthy or DASH diet. - amLODipine (NORVASC) 10 MG tablet; Take 1 tablet (10 mg total) by mouth daily.  Dispense: 90 tablet; Refill: 2 - lisinopril (ZESTRIL) 30 MG tablet; Take 1 tablet (30 mg total) by mouth daily.  Dispense: 90 tablet; Refill: 2 - metoprolol succinate (TOPROL-XL) 50 MG 24 hr tablet; TAKE 1 TABLET BY MOUTH DAILY, TAKE WITH OR FOLLOWING A MEAL  Dispense: 90 tablet; Refill: 2 - CBC with Differential - Comprehensive metabolic panel - Lipid Panel - TSH - Vitamin D, 25-hydroxy - Vitamin B12  2. Seasonal allergies - hydrocortisone cream 0.5 %; Apply 1 application topically 2 (two) times daily.  Dispense: 30 g; Refill: 3  3. Skin rash Intermittent. Stable today.  - hydrocortisone cream 0.5 %; Apply 1 application topically 2 (two) times daily.  Dispense: 30 g; Refill: 3  4. Screening for diabetes mellitus Hgb A1c is stable today at 6.0. She will continue to decrease foods/beverages high in sugars and carbs and follow Heart Healthy or DASH diet. Increase physical activity to at least 30 minutes cardio exercise daily.  - POCT glycosylated hemoglobin (Hb A1C) - POCT urinalysis dipstick  5. Encounter for screening mammogram for  breast cancer We will send her for scheduling of her initial Mammogram today. Because of language barrier, [facility] to contact patient's daughter to schedule appointment. We will place her contact info in chart today.  - MM 3D SCREEN BREAST BILATERAL; Future  6. Language barrier [place]. We had assistance of daughter-in-law with communication today.   7. Follow up She will follow up in 6 months.   Meds ordered this encounter  Medications  . amLODipine (NORVASC) 10 MG tablet    Sig: Take 1 tablet (10 mg total) by mouth daily.    Dispense:  90 tablet    Refill:  2    Patient will get full refill after office visit  . hydrocortisone cream 0.5 %    Sig: Apply 1 application topically 2 (two) times daily.    Dispense:  30 g    Refill:  3  . lisinopril (ZESTRIL) 30 MG tablet    Sig: Take 1 tablet (30 mg total) by mouth daily.    Dispense:  90 tablet    Refill:  2  . metoprolol succinate (TOPROL-XL) 50 MG 24 hr tablet    Sig: TAKE 1 TABLET BY MOUTH DAILY, TAKE WITH OR FOLLOWING A MEAL    Dispense:  90 tablet    Refill:  2    Orders Placed This Encounter  Procedures  . MM 3D SCREEN BREAST BILATERAL  . CBC with Differential  . Comprehensive metabolic panel  . Lipid Panel  . TSH  . Vitamin D, 25-hydroxy  . Vitamin B12  . POCT glycosylated hemoglobin (Hb A1C)  . POCT urinalysis dipstick    Referral Orders  No referral(s) requested today    Raliegh IpNatalie Mallory Enriques,  MSN, FNP-BC [facility] [address]  [place], [place] [id number] [phone]  Problem List Items Addressed This Visit      Cardiovascular and Mediastinum   Hypertension - Primary (Chronic)   Relevant Medications   amLODipine (NORVASC) 10 MG tablet   lisinopril (ZESTRIL)  30 MG tablet   metoprolol succinate (TOPROL-XL) 50 MG 24 hr tablet   Other Relevant Orders   CBC with Differential   Comprehensive metabolic panel    Lipid Panel   TSH   Vitamin D, 25-hydroxy   Vitamin B12    Other Visit Diagnoses    Seasonal allergies       Relevant Medications   hydrocortisone cream 0.5 %   Skin rash       Relevant Medications   hydrocortisone cream 0.5 %   Screening for diabetes mellitus       Relevant Orders   POCT glycosylated hemoglobin (Hb A1C) (Completed)   POCT urinalysis dipstick (Completed)   Encounter for screening mammogram for breast cancer       Relevant Orders   MM 3D SCREEN BREAST BILATERAL   Follow up          Meds ordered this encounter  Medications  . amLODipine (NORVASC) 10 MG tablet    Sig: Take 1 tablet (10 mg total) by mouth daily.    Dispense:  90 tablet    Refill:  2    Patient will get full refill after office visit  . hydrocortisone cream 0.5 %    Sig: Apply 1 application topically 2 (two) times daily.    Dispense:  30 g    Refill:  3  . lisinopril (ZESTRIL) 30 MG tablet    Sig: Take 1 tablet (30 mg total) by mouth daily.    Dispense:  90 tablet    Refill:  2  . metoprolol succinate (TOPROL-XL) 50 MG 24 hr tablet    Sig: TAKE 1 TABLET BY MOUTH DAILY, TAKE WITH OR FOLLOWING A MEAL    Dispense:  90 tablet    Refill:  2    Follow-up: Return in about 6 months (around 04/03/2019).    Azzie Glatter, FNP

## 2018-10-02 LAB — CBC WITH DIFFERENTIAL/PLATELET
Basophils Absolute: 0.1 10*3/uL (ref 0.0–0.2)
Basos: 1 %
EOS (ABSOLUTE): 0.1 10*3/uL (ref 0.0–0.4)
Eos: 1 %
Hematocrit: 38.7 % (ref 34.0–46.6)
Hemoglobin: 12.9 g/dL (ref 11.1–15.9)
Immature Grans (Abs): 0 10*3/uL (ref 0.0–0.1)
Immature Granulocytes: 0 %
Lymphocytes Absolute: 2.1 10*3/uL (ref 0.7–3.1)
Lymphs: 34 %
MCH: 26.5 pg — ABNORMAL LOW (ref 26.6–33.0)
MCHC: 33.3 g/dL (ref 31.5–35.7)
MCV: 80 fL (ref 79–97)
Monocytes Absolute: 0.3 10*3/uL (ref 0.1–0.9)
Monocytes: 5 %
Neutrophils Absolute: 3.8 10*3/uL (ref 1.4–7.0)
Neutrophils: 59 %
Platelets: 293 10*3/uL (ref 150–450)
RBC: 4.87 x10E6/uL (ref 3.77–5.28)
RDW: 13.7 % (ref 11.7–15.4)
WBC: 6.4 10*3/uL (ref 3.4–10.8)

## 2018-10-02 LAB — LIPID PANEL
Chol/HDL Ratio: 3 ratio (ref 0.0–4.4)
Cholesterol, Total: 235 mg/dL — ABNORMAL HIGH (ref 100–199)
HDL: 78 mg/dL (ref >39)
LDL Calculated: 129 mg/dL — ABNORMAL HIGH (ref 0–99)
Triglycerides: 140 mg/dL (ref 0–149)
VLDL Cholesterol Cal: 28 mg/dL (ref 5–40)

## 2018-10-02 LAB — COMPREHENSIVE METABOLIC PANEL
ALT: 21 IU/L (ref 0–32)
AST: 21 IU/L (ref 0–40)
Albumin/Globulin Ratio: 1.7 (ref 1.2–2.2)
Albumin: 5 g/dL — ABNORMAL HIGH (ref 3.8–4.8)
Alkaline Phosphatase: 59 IU/L (ref 39–117)
BUN/Creatinine Ratio: 19 (ref 12–28)
BUN: 15 mg/dL (ref 8–27)
Bilirubin Total: 0.6 mg/dL (ref 0.0–1.2)
CO2: 19 mmol/L — ABNORMAL LOW (ref 20–29)
Calcium: 9.7 mg/dL (ref 8.7–10.3)
Chloride: 103 mmol/L (ref 96–106)
Creatinine, Ser: 0.79 mg/dL (ref 0.57–1.00)
GFR calc Af Amer: 92 mL/min/{1.73_m2} (ref >59)
GFR calc non Af Amer: 80 mL/min/{1.73_m2} (ref >59)
Globulin, Total: 3 g/dL (ref 1.5–4.5)
Glucose: 99 mg/dL (ref 65–99)
Potassium: 3.9 mmol/L (ref 3.5–5.2)
Sodium: 140 mmol/L (ref 134–144)
Total Protein: 8 g/dL (ref 6.0–8.5)

## 2018-10-02 LAB — VITAMIN B12: Vitamin B-12: 430 pg/mL (ref 232–1245)

## 2018-10-02 LAB — VITAMIN D 25 HYDROXY (VIT D DEFICIENCY, FRACTURES): Vit D, 25-Hydroxy: 28.8 ng/mL — ABNORMAL LOW (ref 30.0–100.0)

## 2018-10-02 LAB — TSH: TSH: 1.66 u[IU]/mL (ref 0.450–4.500)

## 2019-04-03 ENCOUNTER — Other Ambulatory Visit: Payer: Self-pay

## 2019-04-03 ENCOUNTER — Ambulatory Visit (INDEPENDENT_AMBULATORY_CARE_PROVIDER_SITE_OTHER): Payer: Self-pay | Admitting: Family Medicine

## 2019-04-03 ENCOUNTER — Encounter: Payer: Self-pay | Admitting: Family Medicine

## 2019-04-03 VITALS — BP 138/76 | HR 100 | Temp 98.7°F | Ht 61.0 in | Wt 115.2 lb

## 2019-04-03 DIAGNOSIS — R8290 Unspecified abnormal findings in urine: Secondary | ICD-10-CM

## 2019-04-03 DIAGNOSIS — Z758 Other problems related to medical facilities and other health care: Secondary | ICD-10-CM

## 2019-04-03 DIAGNOSIS — I10 Essential (primary) hypertension: Secondary | ICD-10-CM

## 2019-04-03 DIAGNOSIS — Z09 Encounter for follow-up examination after completed treatment for conditions other than malignant neoplasm: Secondary | ICD-10-CM

## 2019-04-03 DIAGNOSIS — Z0000 Encounter for general adult medical examination without abnormal findings: Secondary | ICD-10-CM

## 2019-04-03 DIAGNOSIS — Z789 Other specified health status: Secondary | ICD-10-CM

## 2019-04-03 LAB — POCT URINALYSIS DIPSTICK
Bilirubin, UA: NEGATIVE
Glucose, UA: NEGATIVE
Ketones, UA: NEGATIVE
Leukocytes, UA: NEGATIVE
Nitrite, UA: NEGATIVE
Protein, UA: NEGATIVE
Spec Grav, UA: <=1.005 — AB (ref 1.010–1.025)
Urobilinogen, UA: 0.2 E.U./dL
pH, UA: 5.5 (ref 5.0–8.0)

## 2019-04-03 LAB — POCT GLYCOSYLATED HEMOGLOBIN (HGB A1C): Hemoglobin A1C: 5.7 % — AB (ref 4.0–5.6)

## 2019-04-03 LAB — GLUCOSE, POCT (MANUAL RESULT ENTRY): POC Glucose: 121 mg/dl — AB (ref 70–99)

## 2019-04-03 NOTE — Progress Notes (Signed)
[facility]   Established Patient Office Visit  Subjective:  Patient ID: Molly Rodriguez, female    DOB: 1955/08/21  Age: 65 y.o. MRN: [id number]  CC:  Chief Complaint  Patient presents with  . Follow-up    6 mth HTN    HPI Molly Rodriguez is a 64 year old female who presents for Follow Up today.   Past Medical History:  Diagnosis Date  . Hypercholesteremia   . Hypertension    Current Status: Since her last office visit, she is doing well with no complaints. Her daughter-in-law is here with her today. She denies fevers, chills, fatigue, recent infections, weight loss, and night sweats. She has not had any headaches, visual changes, dizziness, and falls. No chest pain, heart palpitations, cough and shortness of breath reported. No reports of GI problems such as nausea, vomiting, diarrhea, and constipation. She has no reports of blood in stools, dysuria and hematuria. No depression or anxiety, and denies suicidal ideations, homicidal ideations, or auditory hallucinations. She denies pain today.   History reviewed. No pertinent surgical history.  History reviewed. No pertinent family history.  Social History   Socioeconomic History  . Marital status: Single    Spouse name: Not on file  . Number of children: Not on file  . Years of education: Not on file  . Highest education level: Not on file  Occupational History  . Not on file  Tobacco Use  . Smoking status: Never Smoker  . Smokeless tobacco: Never Used  Substance and Sexual Activity  . Alcohol use: Yes    Alcohol/week: 2.0 standard drinks    Types: 2 Cans of beer per week    Comment: daily  . Drug use: No  . Sexual activity: Not Currently    Birth control/protection: Post-menopausal  Other Topics Concern  . Not on file  Social History Narrative  . Not on file   Social Determinants of Health   Financial Resource Strain:   . Difficulty of Paying Living Expenses: Not on  file  Food Insecurity:   . Worried About Charity fundraiser in the Last Year: Not on file  . Ran Out of Food in the Last Year: Not on file  Transportation Needs:   . Lack of Transportation (Medical): Not on file  . Lack of Transportation (Non-Medical): Not on file  Physical Activity:   . Days of Exercise per Week: Not on file  . Minutes of Exercise per Session: Not on file  Stress:   . Feeling of Stress : Not on file  Social Connections:   . Frequency of Communication with Friends and Family: Not on file  . Frequency of Social Gatherings with Friends and Family: Not on file  . Attends Religious Services: Not on file  . Active Member of Clubs or Organizations: Not on file  . Attends Archivist Meetings: Not on file  . Marital Status: Not on file  Intimate Partner Violence:   . Fear of Current or Ex-Partner: Not on file  . Emotionally Abused: Not on file  . Physically Abused: Not on file  . Sexually Abused: Not on file    Outpatient Medications Prior to Visit  Medication Sig Dispense Refill  . amLODipine (NORVASC) 10 MG tablet Take 1 tablet (10 mg total) by mouth daily. 90 tablet 2  . hydrocortisone cream 0.5 % Apply 1 application topically 2 (two) times daily. 30 g 3  . lisinopril (ZESTRIL)  30 MG tablet Take 1 tablet (30 mg total) by mouth daily. 90 tablet 2  . metoprolol succinate (TOPROL-XL) 50 MG 24 hr tablet TAKE 1 TABLET BY MOUTH DAILY, TAKE WITH OR FOLLOWING A MEAL 90 tablet 2   No facility-administered medications prior to visit.    No Known Allergies  ROS Review of Systems  Constitutional: Negative.   HENT: Negative.   Eyes: Negative.   Respiratory: Negative.   Cardiovascular: Negative.   Gastrointestinal: Negative.   Endocrine: Negative.   Genitourinary: Negative.   Musculoskeletal: Negative.   Skin: Negative.   Allergic/Immunologic: Negative.   Neurological: Positive for dizziness (occasional ) and headaches (occasional).  Hematological:  Negative.   Psychiatric/Behavioral: Negative.       Objective:    Physical Exam  Constitutional: She is oriented to person, place, and time. She appears well-developed and well-nourished.  HENT:  Head: Normocephalic and atraumatic.  Eyes: Conjunctivae are normal.  Cardiovascular: Normal rate, regular rhythm, normal heart sounds and intact distal pulses.  Pulmonary/Chest: Effort normal and breath sounds normal.  Abdominal: Soft. Bowel sounds are normal.  Musculoskeletal:        General: Normal range of motion.     Cervical back: Normal range of motion and neck supple.  Neurological: She is alert and oriented to person, place, and time. She has normal reflexes.  Skin: Skin is warm and dry.  Psychiatric: She has a normal mood and affect. Her behavior is normal. Judgment and thought content normal.  Nursing note and vitals reviewed.   BP 138/76   Pulse 100   Temp 98.7 F (37.1 C)   Ht 5\' 1"  (1.549 m)   Wt 115 lb 3.2 oz (52.3 kg)   SpO2 98%   BMI 21.77 kg/m  Wt Readings from Last 3 Encounters:  04/03/19 115 lb 3.2 oz (52.3 kg)  10/01/18 112 lb (50.8 kg)  01/27/18 113 lb (51.3 kg)     Health Maintenance Due  Topic Date Due  . MAMMOGRAM  06/28/2005  . COLONOSCOPY  06/28/2005  . INFLUENZA VACCINE  09/27/2018    There are no preventive care reminders to display for this patient.  Lab Results  Component Value Date   TSH 1.660 10/01/2018   Lab Results  Component Value Date   WBC 6.4 10/01/2018   HGB 12.9 10/01/2018   HCT 38.7 10/01/2018   MCV 80 10/01/2018   PLT 293 10/01/2018   Lab Results  Component Value Date   NA 140 10/01/2018   K 3.9 10/01/2018   CO2 19 (L) 10/01/2018   GLUCOSE 99 10/01/2018   BUN 15 10/01/2018   CREATININE 0.79 10/01/2018   BILITOT 0.6 10/01/2018   ALKPHOS 59 10/01/2018   AST 21 10/01/2018   ALT 21 10/01/2018   PROT 8.0 10/01/2018   ALBUMIN 5.0 (H) 10/01/2018   CALCIUM 9.7 10/01/2018   ANIONGAP 9 12/17/2015   Lab Results    Component Value Date   CHOL 235 (H) 10/01/2018   Lab Results  Component Value Date   HDL 78 10/01/2018   Lab Results  Component Value Date   LDLCALC 129 (H) 10/01/2018   Lab Results  Component Value Date   TRIG 140 10/01/2018   Lab Results  Component Value Date   CHOLHDL 3.0 10/01/2018   Lab Results  Component Value Date   HGBA1C 5.7 (A) 04/03/2019      Assessment & Plan:   1. Essential hypertension The current medical regimen is effective; blood pressure is  stable at 138/76 today; continue present plan and medications as prescribed. She will continue to take medications as prescribed, to decrease high sodium intake, excessive alcohol intake, increase potassium intake, smoking cessation, and increase physical activity of at least 30 minutes of cardio activity daily. She will continue to follow Heart Healthy or DASH diet.  2. Language barrier She is originally from [place]. Her daughter-in-law will interpret today.   3. Abnormal urinalysis Result are pending. - Urine Culture  4. Health care maintenance - POCT urinalysis dipstick - POCT glycosylated hemoglobin (Hb A1C) - POCT glucose (manual entry)  5. Follow up She will follow up in 6 months.   No orders of the defined types were placed in this encounter.  Orders Placed This Encounter  Procedures  . Urine Culture  . POCT urinalysis dipstick  . POCT glycosylated hemoglobin (Hb A1C)  . POCT glucose (manual entry)    Referral Orders  No referral(s) requested today   Raliegh Ip,  MSN, FNP-BC [facility] [address] [place], [place] [address] [phone]  Problem List Items Addressed This Visit      Cardiovascular and Mediastinum   Hypertension - Primary (Chronic)    Other Visit Diagnoses    Language barrier       Abnormal urinalysis       Relevant Orders   Urine Culture   Health care maintenance        Relevant Orders   POCT urinalysis dipstick (Completed)   POCT glycosylated hemoglobin (Hb A1C) (Completed)   POCT glucose (manual entry) (Completed)      No orders of the defined types were placed in this encounter.   Follow-up: Return in about 6 months (around 10/01/2019).    Kallie Locks, FNP

## 2019-04-04 DIAGNOSIS — Z758 Other problems related to medical facilities and other health care: Secondary | ICD-10-CM | POA: Insufficient documentation

## 2019-04-04 DIAGNOSIS — Z789 Other specified health status: Secondary | ICD-10-CM | POA: Insufficient documentation

## 2019-04-05 LAB — URINE CULTURE: Organism ID, Bacteria: NO GROWTH

## 2019-07-04 ENCOUNTER — Other Ambulatory Visit: Payer: Self-pay | Admitting: Family Medicine

## 2019-07-04 DIAGNOSIS — I10 Essential (primary) hypertension: Secondary | ICD-10-CM

## 2019-09-05 ENCOUNTER — Other Ambulatory Visit: Payer: Self-pay | Admitting: Family Medicine

## 2019-09-05 DIAGNOSIS — I10 Essential (primary) hypertension: Secondary | ICD-10-CM

## 2019-10-02 ENCOUNTER — Other Ambulatory Visit: Payer: Self-pay

## 2019-10-02 ENCOUNTER — Encounter: Payer: Self-pay | Admitting: Family Medicine

## 2019-10-02 ENCOUNTER — Ambulatory Visit (INDEPENDENT_AMBULATORY_CARE_PROVIDER_SITE_OTHER): Payer: Self-pay | Admitting: Family Medicine

## 2019-10-02 VITALS — BP 150/77 | HR 108 | Temp 98.3°F | Resp 14 | Ht 61.0 in | Wt 112.0 lb

## 2019-10-02 DIAGNOSIS — Z789 Other specified health status: Secondary | ICD-10-CM

## 2019-10-02 DIAGNOSIS — Z0000 Encounter for general adult medical examination without abnormal findings: Secondary | ICD-10-CM

## 2019-10-02 DIAGNOSIS — J302 Other seasonal allergic rhinitis: Secondary | ICD-10-CM

## 2019-10-02 DIAGNOSIS — Z1231 Encounter for screening mammogram for malignant neoplasm of breast: Secondary | ICD-10-CM

## 2019-10-02 DIAGNOSIS — Z1211 Encounter for screening for malignant neoplasm of colon: Secondary | ICD-10-CM

## 2019-10-02 DIAGNOSIS — Z758 Other problems related to medical facilities and other health care: Secondary | ICD-10-CM

## 2019-10-02 DIAGNOSIS — Z09 Encounter for follow-up examination after completed treatment for conditions other than malignant neoplasm: Secondary | ICD-10-CM

## 2019-10-02 DIAGNOSIS — R21 Rash and other nonspecific skin eruption: Secondary | ICD-10-CM

## 2019-10-02 DIAGNOSIS — I10 Essential (primary) hypertension: Secondary | ICD-10-CM

## 2019-10-02 LAB — POCT URINALYSIS DIPSTICK
Bilirubin, UA: NEGATIVE
Blood, UA: NEGATIVE
Glucose, UA: NEGATIVE
Ketones, UA: NEGATIVE
Leukocytes, UA: NEGATIVE
Nitrite, UA: NEGATIVE
Protein, UA: NEGATIVE
Spec Grav, UA: 1.01 (ref 1.010–1.025)
Urobilinogen, UA: 0.2 E.U./dL
pH, UA: 6 (ref 5.0–8.0)

## 2019-10-02 MED ORDER — AMLODIPINE BESYLATE 10 MG PO TABS: ORAL_TABLET | ORAL | 3 refills | Status: DC

## 2019-10-02 MED ORDER — HYDROCORTISONE 0.5 % EX CREA: 1.0000 "application " | TOPICAL_CREAM | Freq: Two times a day (BID) | CUTANEOUS | 6 refills | Status: DC

## 2019-10-02 MED ORDER — METOPROLOL SUCCINATE ER 50 MG PO TB24: ORAL_TABLET | ORAL | 3 refills | Status: DC

## 2019-10-02 MED ORDER — LISINOPRIL 30 MG PO TABS: ORAL_TABLET | ORAL | 3 refills | Status: DC

## 2019-10-02 NOTE — Progress Notes (Signed)
[facility]  Annual Physical  Subjective:  Patient ID: Molly Rodriguez, female    DOB: August 06, 1955  Age: 64 y.o. MRN: [id number]  CC:  Chief Complaint  Patient presents with  . Hypertension    HPI Molly Rodriguez is a 64 year old female who presents for her Annual Physical today.     Patient Active Problem List   Diagnosis Date Noted  . Language barrier 04/04/2019  . Seasonal allergies 10/01/2018  . Skin rash 10/01/2018  . Hyponatremia 12/15/2015  . Hypokalemia 12/15/2015  . History of alcohol dependence (HCC) 12/15/2015  . Dysuria 11/12/2010  . Hypertension 11/06/2010    Past Medical History:  Diagnosis Date  . Hypercholesteremia   . Hypertension    Current Status: Since her last office visit, she is doing well with no complaints. Her daughter is here to assist in Interpretation. She denies visual changes, chest pain, cough, shortness of breath, heart palpitations, and falls. She has occasional headaches and dizziness with position changes. Denies severe headaches, confusion, seizures, double vision, and blurred vision, nausea and vomiting. She denies fevers, chills, fatigue, recent infections, weight loss, and night sweats. Denies GI problems such as diarrhea, and constipation. She has no reports of blood in stools, dysuria and hematuria. No depression or anxiety reported today. She is taking all medications as prescribed. She denies pain today.   History reviewed. No pertinent surgical history.  History reviewed. No pertinent family history.  Social History   Socioeconomic History  . Marital status: Single    Spouse name: Not on file  . Number of children: Not on file  . Years of education: Not on file  . Highest education level: Not on file  Occupational History  . Not on file  Tobacco Use  . Smoking status: Never Smoker  . Smokeless tobacco: Never Used  Vaping Use  . Vaping Use: Never used  Substance and Sexual  Activity  . Alcohol use: Yes    Alcohol/week: 2.0 standard drinks    Types: 2 Cans of beer per week    Comment: daily  . Drug use: No  . Sexual activity: Not Currently    Birth control/protection: Post-menopausal  Other Topics Concern  . Not on file  Social History Narrative  . Not on file   Social Determinants of Health   Financial Resource Strain:   . Difficulty of Paying Living Expenses:   Food Insecurity:   . Worried About Programme researcher, broadcasting/film/video in the Last Year:   . Barista in the Last Year:   Transportation Needs:   . Freight forwarder (Medical):   Marland Kitchen Lack of Transportation (Non-Medical):   Physical Activity:   . Days of Exercise per Week:   . Minutes of Exercise per Session:   Stress:   . Feeling of Stress :   Social Connections:   . Frequency of Communication with Friends and Family:   . Frequency of Social Gatherings with Friends and Family:   . Attends Religious Services:   . Active Member of Clubs or Organizations:   . Attends Banker Meetings:   Marland Kitchen Marital Status:   Intimate Partner Violence:   . Fear of Current or Ex-Partner:   . Emotionally Abused:   Marland Kitchen Physically Abused:   . Sexually Abused:     Outpatient Medications Prior to Visit  Medication Sig Dispense Refill  . amLODipine (NORVASC) 10 MG tablet TAKE 1 TABLET(10 MG)  BY MOUTH DAILY 90 tablet 2  . hydrocortisone cream 0.5 % Apply 1 application topically 2 (two) times daily. 30 g 3  . lisinopril (ZESTRIL) 30 MG tablet TAKE 1 TABLET(30 MG) BY MOUTH DAILY 90 tablet 0  . metoprolol succinate (TOPROL-XL) 50 MG 24 hr tablet TAKE 1 TABLET BY MOUTH EVERY DAY, TAKE WITH A MEAL OR FOLLOWING A MEAL 90 tablet 2   No facility-administered medications prior to visit.    No Known Allergies  ROS Review of Systems  Constitutional: Negative.   HENT: Negative.   Eyes: Negative.   Respiratory: Negative.   Cardiovascular: Negative.   Gastrointestinal: Negative.   Endocrine: Negative.     Genitourinary: Negative.   Musculoskeletal: Positive for arthralgias (generlized joint pain).  Skin: Negative.   Allergic/Immunologic: Negative.   Neurological: Positive for dizziness (occasional ) and headaches (occasional).  Hematological: Negative.   Psychiatric/Behavioral: Negative.       Objective:    Physical Exam Vitals and nursing note reviewed.  Constitutional:      Appearance: Normal appearance.  HENT:     Head: Normocephalic and atraumatic.     Nose: Nose normal.     Mouth/Throat:     Mouth: Mucous membranes are moist.  Cardiovascular:     Rate and Rhythm: Normal rate and regular rhythm.  Pulmonary:     Effort: Pulmonary effort is normal.     Breath sounds: Normal breath sounds.  Musculoskeletal:     Cervical back: Normal range of motion and neck supple.  Neurological:     Mental Status: She is alert.     BP (!) 150/77 (BP Location: Right Arm, Patient Position: Sitting, Cuff Size: Normal)   Pulse (!) 108   Temp 98.3 F (36.8 C) (Oral)   Resp 14   Ht 5\' 1"  (1.549 m)   Wt 112 lb (50.8 kg)   SpO2 100%   BMI 21.16 kg/m  Wt Readings from Last 3 Encounters:  10/02/19 112 lb (50.8 kg)  04/03/19 115 lb 3.2 oz (52.3 kg)  10/01/18 112 lb (50.8 kg)    Health Maintenance Due  Topic Date Due  . MAMMOGRAM  Never done  . INFLUENZA VACCINE  09/27/2019    There are no preventive care reminders to display for this patient.  Lab Results  Component Value Date   TSH 0.985 10/02/2019   Lab Results  Component Value Date   WBC 7.2 10/02/2019   HGB 12.3 10/02/2019   HCT 37.8 10/02/2019   MCV 80 10/02/2019   PLT 286 10/02/2019   Lab Results  Component Value Date   NA 139 10/02/2019   K 3.9 10/02/2019   CO2 20 10/02/2019   GLUCOSE 109 (H) 10/02/2019   BUN 14 10/02/2019   CREATININE 0.73 10/02/2019   BILITOT 0.7 10/02/2019   ALKPHOS 65 10/02/2019   AST 20 10/02/2019   ALT 21 10/02/2019   PROT 8.0 10/02/2019   ALBUMIN 5.0 (H) 10/02/2019   CALCIUM 9.6  10/02/2019   ANIONGAP 9 12/17/2015   Lab Results  Component Value Date   CHOL 244 (H) 10/02/2019   Lab Results  Component Value Date   HDL 76 10/02/2019   Lab Results  Component Value Date   LDLCALC 141 (H) 10/02/2019   Lab Results  Component Value Date   TRIG 155 (H) 10/02/2019   Lab Results  Component Value Date   CHOLHDL 3.2 10/02/2019   Lab Results  Component Value Date   HGBA1C 5.7 (A) 04/03/2019  Assessment & Plan:   1. Language barrier  2. Essential hypertension She will continue to take medications as prescribed, to decrease high sodium intake, excessive alcohol intake, increase potassium intake, smoking cessation, and increase physical activity of at least 30 minutes of cardio activity daily. She will continue to follow Heart Healthy or DASH diet. - Urinalysis Dipstick - amLODipine (NORVASC) 10 MG tablet; TAKE 1 TABLET(10 MG) BY MOUTH DAILY  Dispense: 90 tablet; Refill: 3 - lisinopril (ZESTRIL) 30 MG tablet; TAKE 1 TABLET(30 MG) BY MOUTH DAILY  Dispense: 90 tablet; Refill: 3 - metoprolol succinate (TOPROL-XL) 50 MG 24 hr tablet; TAKE 1 TABLET BY MOUTH EVERY DAY, TAKE WITH A MEAL OR FOLLOWING A MEAL  Dispense: 90 tablet; Refill: 3  3. Seasonal allergies - hydrocortisone cream 0.5 %; Apply 1 application topically 2 (two) times daily.  Dispense: 30 g; Refill: 6  4. Skin rash - hydrocortisone cream 0.5 %; Apply 1 application topically 2 (two) times daily.  Dispense: 30 g; Refill: 6  5. Encounter for screening mammogram for malignant neoplasm of breast - MM DIGITAL SCREENING BILATERAL; Future  6. Colon cancer screening - Ambulatory referral to Gastroenterology  7. Healthcare maintenance - CBC with Differential - Comprehensive metabolic panel - TSH - Lipid Panel - Vitamin D, 25-hydroxy - Vitamin B12  8. Follow up She will follow up in 1 year.   Meds ordered this encounter  Medications  . amLODipine (NORVASC) 10 MG tablet    Sig: TAKE 1 TABLET(10 MG)  BY MOUTH DAILY    Dispense:  90 tablet    Refill:  3  . hydrocortisone cream 0.5 %    Sig: Apply 1 application topically 2 (two) times daily.    Dispense:  30 g    Refill:  6  . lisinopril (ZESTRIL) 30 MG tablet    Sig: TAKE 1 TABLET(30 MG) BY MOUTH DAILY    Dispense:  90 tablet    Refill:  3  . metoprolol succinate (TOPROL-XL) 50 MG 24 hr tablet    Sig: TAKE 1 TABLET BY MOUTH EVERY DAY, TAKE WITH A MEAL OR FOLLOWING A MEAL    Dispense:  90 tablet    Refill:  3    Orders Placed This Encounter  Procedures  . MM DIGITAL SCREENING BILATERAL  . CBC with Differential  . Comprehensive metabolic panel  . TSH  . Lipid Panel  . Vitamin D, 25-hydroxy  . Vitamin B12  . Ambulatory referral to Gastroenterology  . Urinalysis Dipstick     Referral Orders     Ambulatory referral to Gastroenterology   Raliegh IpNatalie Carra Brindley,  MSN, FNP-BC [facility] [address]  [place], [place] [address] [phone]   Problem List Items Addressed This Visit      Cardiovascular and Mediastinum   Hypertension (Chronic)   Relevant Medications   amLODipine (NORVASC) 10 MG tablet   lisinopril (ZESTRIL) 30 MG tablet   metoprolol succinate (TOPROL-XL) 50 MG 24 hr tablet   Other Relevant Orders   Urinalysis Dipstick (Completed)     Musculoskeletal and Integument   Skin rash   Relevant Medications   hydrocortisone cream 0.5 %     Other   Language barrier - Primary   Seasonal allergies   Relevant Medications   hydrocortisone cream 0.5 %    Other Visit Diagnoses    Encounter for screening mammogram for malignant neoplasm of breast  Relevant Orders   MM DIGITAL SCREENING BILATERAL   Colon cancer screening       Relevant Orders   Ambulatory referral to Gastroenterology   Healthcare maintenance       Relevant Orders   CBC with Differential (Completed)   Comprehensive metabolic panel  (Completed)   TSH (Completed)   Lipid Panel (Completed)   Vitamin D, 25-hydroxy (Completed)   Vitamin B12 (Completed)   Follow up          Meds ordered this encounter  Medications  . amLODipine (NORVASC) 10 MG tablet    Sig: TAKE 1 TABLET(10 MG) BY MOUTH DAILY    Dispense:  90 tablet    Refill:  3  . hydrocortisone cream 0.5 %    Sig: Apply 1 application topically 2 (two) times daily.    Dispense:  30 g    Refill:  6  . lisinopril (ZESTRIL) 30 MG tablet    Sig: TAKE 1 TABLET(30 MG) BY MOUTH DAILY    Dispense:  90 tablet    Refill:  3  . metoprolol succinate (TOPROL-XL) 50 MG 24 hr tablet    Sig: TAKE 1 TABLET BY MOUTH EVERY DAY, TAKE WITH A MEAL OR FOLLOWING A MEAL    Dispense:  90 tablet    Refill:  3    Follow-up: Return in about 1 year (around 10/01/2020).    Kallie Locks, FNP

## 2019-10-03 LAB — CBC WITH DIFFERENTIAL/PLATELET
Basophils Absolute: 0.1 10*3/uL (ref 0.0–0.2)
Basos: 1 %
EOS (ABSOLUTE): 0.1 10*3/uL (ref 0.0–0.4)
Eos: 1 %
Hematocrit: 37.8 % (ref 34.0–46.6)
Hemoglobin: 12.3 g/dL (ref 11.1–15.9)
Immature Grans (Abs): 0 10*3/uL (ref 0.0–0.1)
Immature Granulocytes: 0 %
Lymphocytes Absolute: 3 10*3/uL (ref 0.7–3.1)
Lymphs: 41 %
MCH: 25.9 pg — ABNORMAL LOW (ref 26.6–33.0)
MCHC: 32.5 g/dL (ref 31.5–35.7)
MCV: 80 fL (ref 79–97)
Monocytes Absolute: 0.3 10*3/uL (ref 0.1–0.9)
Monocytes: 4 %
Neutrophils Absolute: 3.8 10*3/uL (ref 1.4–7.0)
Neutrophils: 53 %
Platelets: 286 10*3/uL (ref 150–450)
RBC: 4.75 x10E6/uL (ref 3.77–5.28)
RDW: 14.3 % (ref 11.7–15.4)
WBC: 7.2 10*3/uL (ref 3.4–10.8)

## 2019-10-03 LAB — COMPREHENSIVE METABOLIC PANEL
ALT: 21 IU/L (ref 0–32)
AST: 20 IU/L (ref 0–40)
Albumin/Globulin Ratio: 1.7 (ref 1.2–2.2)
Albumin: 5 g/dL — ABNORMAL HIGH (ref 3.8–4.8)
Alkaline Phosphatase: 65 IU/L (ref 48–121)
BUN/Creatinine Ratio: 19 (ref 12–28)
BUN: 14 mg/dL (ref 8–27)
Bilirubin Total: 0.7 mg/dL (ref 0.0–1.2)
CO2: 20 mmol/L (ref 20–29)
Calcium: 9.6 mg/dL (ref 8.7–10.3)
Chloride: 105 mmol/L (ref 96–106)
Creatinine, Ser: 0.73 mg/dL (ref 0.57–1.00)
GFR calc Af Amer: 101 mL/min/{1.73_m2} (ref >59)
GFR calc non Af Amer: 87 mL/min/{1.73_m2} (ref >59)
Globulin, Total: 3 g/dL (ref 1.5–4.5)
Glucose: 109 mg/dL — ABNORMAL HIGH (ref 65–99)
Potassium: 3.9 mmol/L (ref 3.5–5.2)
Sodium: 139 mmol/L (ref 134–144)
Total Protein: 8 g/dL (ref 6.0–8.5)

## 2019-10-03 LAB — LIPID PANEL
Chol/HDL Ratio: 3.2 ratio (ref 0.0–4.4)
Cholesterol, Total: 244 mg/dL — ABNORMAL HIGH (ref 100–199)
HDL: 76 mg/dL (ref >39)
LDL Chol Calc (NIH): 141 mg/dL — ABNORMAL HIGH (ref 0–99)
Triglycerides: 155 mg/dL — ABNORMAL HIGH (ref 0–149)
VLDL Cholesterol Cal: 27 mg/dL (ref 5–40)

## 2019-10-03 LAB — VITAMIN D 25 HYDROXY (VIT D DEFICIENCY, FRACTURES): Vit D, 25-Hydroxy: 29.6 ng/mL — ABNORMAL LOW (ref 30.0–100.0)

## 2019-10-03 LAB — TSH: TSH: 0.985 u[IU]/mL (ref 0.450–4.500)

## 2019-10-03 LAB — VITAMIN B12: Vitamin B-12: 462 pg/mL (ref 232–1245)

## 2019-10-05 ENCOUNTER — Telehealth: Payer: Self-pay

## 2019-10-05 NOTE — Telephone Encounter (Signed)
Called, no answer. Left a message to call back. Thanks!  

## 2019-10-05 NOTE — Telephone Encounter (Signed)
-----   Message from Kallie Locks, FNP sent at 10/05/2019  8:41 AM EDT ----- Cholesterol levels are mildly elevated. Continue low-fat, low cholesterol diet, increase fluids, increase fruits and vegetables. She will continue to decrease high sodium intake, excessive alcohol intake, increase potassium intake, smoking cessation, and increase physical activity of at least 30 minutes of cardio activity daily. She will continue to follow Heart Healthy or DASH diet.  All other labs are stable. Keep follow up appointment. Please inform patient.

## 2019-10-07 ENCOUNTER — Encounter: Payer: Self-pay | Admitting: Family Medicine

## 2019-10-07 NOTE — Telephone Encounter (Signed)
Called, no answer left a message to call back.  

## 2019-10-09 NOTE — Telephone Encounter (Signed)
Called, no answer. Left a message.  

## 2019-10-09 NOTE — Telephone Encounter (Signed)
Will mail letter to contact our office about labs. Thanks!

## 2020-09-26 ENCOUNTER — Other Ambulatory Visit: Payer: Self-pay

## 2020-09-26 DIAGNOSIS — I10 Essential (primary) hypertension: Secondary | ICD-10-CM

## 2020-09-26 MED ORDER — LISINOPRIL 30 MG PO TABS: ORAL_TABLET | ORAL | 3 refills | Status: DC

## 2020-09-26 MED ORDER — METOPROLOL SUCCINATE ER 50 MG PO TB24: ORAL_TABLET | ORAL | 3 refills | Status: DC

## 2020-09-26 MED ORDER — AMLODIPINE BESYLATE 10 MG PO TABS: ORAL_TABLET | ORAL | 3 refills | Status: DC

## 2020-09-30 ENCOUNTER — Other Ambulatory Visit: Payer: Self-pay

## 2020-09-30 ENCOUNTER — Ambulatory Visit (INDEPENDENT_AMBULATORY_CARE_PROVIDER_SITE_OTHER): Payer: Self-pay | Admitting: Nurse Practitioner

## 2020-09-30 ENCOUNTER — Encounter: Payer: Self-pay | Admitting: Nurse Practitioner

## 2020-09-30 ENCOUNTER — Ambulatory Visit: Payer: Self-pay | Admitting: Family Medicine

## 2020-09-30 VITALS — BP 161/82 | Temp 98.2°F | Ht 61.0 in | Wt 107.0 lb

## 2020-09-30 DIAGNOSIS — R8290 Unspecified abnormal findings in urine: Secondary | ICD-10-CM

## 2020-09-30 DIAGNOSIS — I10 Essential (primary) hypertension: Secondary | ICD-10-CM

## 2020-09-30 DIAGNOSIS — F1021 Alcohol dependence, in remission: Secondary | ICD-10-CM

## 2020-09-30 DIAGNOSIS — Z23 Encounter for immunization: Secondary | ICD-10-CM

## 2020-09-30 DIAGNOSIS — Z131 Encounter for screening for diabetes mellitus: Secondary | ICD-10-CM

## 2020-09-30 DIAGNOSIS — Z0000 Encounter for general adult medical examination without abnormal findings: Secondary | ICD-10-CM

## 2020-09-30 DIAGNOSIS — Z13220 Encounter for screening for lipoid disorders: Secondary | ICD-10-CM

## 2020-09-30 LAB — POCT URINALYSIS DIPSTICK
Bilirubin, UA: NEGATIVE
Glucose, UA: NEGATIVE
Ketones, UA: NEGATIVE
Leukocytes, UA: NEGATIVE
Nitrite, UA: NEGATIVE
Protein, UA: NEGATIVE
Spec Grav, UA: 1.01 (ref 1.010–1.025)
Urobilinogen, UA: 0.2 E.U./dL
pH, UA: 6.5 (ref 5.0–8.0)

## 2020-09-30 NOTE — Progress Notes (Signed)
[facility] Patient [facility] [address] [place], [place]  [address] Phone:  [phone]   Fax:  [phone]   Established Patient Office Visit  Subjective:  Patient ID: Molly Rodriguez, female    DOB: May 09, 1955  Age: 65 y.o. MRN: [id number]  CC:  Chief Complaint  Patient presents with   Follow-up    No questions or concerns.    HPI Angeleigh Chiasson presents for follow up.  A former patient of NP Stroud. She  has a past medical history of Hypercholesteremia and Hypertension.   Hypertension Patient is here for follow-up of elevated blood pressure. She is exercising and is adherent to a low-salt diet. Blood pressure is not  monitored at home. Cardiac symptoms: chest pain, chest pressure/discomfort, claudication, dyspnea, exertional chest pressure/discomfort, fatigue, irregular heart beat, lower extremity edema, near-syncope, orthopnea, palpitations, paroxysmal nocturnal dyspnea, syncope, and tachypnea. Patient denies chest pain, chest pressure/discomfort, claudication, dyspnea, exertional chest pressure/discomfort, fatigue, irregular heart beat, lower extremity edema, near-syncope, orthopnea, palpitations, paroxysmal nocturnal dyspnea, syncope, and tachypnea. Cardiovascular risk factors: none. Use of agents associated with hypertension: none. History of target organ damage: none . She reports drinking 2 can of beer per day.   Past Medical History:  Diagnosis Date   Hypercholesteremia    Hypertension     History reviewed. No pertinent surgical history.  History reviewed. No pertinent family history.  Social History   Socioeconomic History   Marital status: Single    Spouse name: Not on file   Number of children: Not on file   Years of education: Not on file   Highest education level: Not on file  Occupational History   Not on file  Tobacco Use   Smoking status: Never   Smokeless tobacco: Never  Vaping Use   Vaping Use: Never used  Substance and Sexual Activity   Alcohol  use: Yes    Alcohol/week: 2.0 standard drinks    Types: 2 Cans of beer per week    Comment: daily   Drug use: No   Sexual activity: Not Currently    Birth control/protection: Post-menopausal  Other Topics Concern   Not on file  Social History Narrative   Not on file   Social Determinants of Health   Financial Resource Strain: Not on file  Food Insecurity: Not on file  Transportation Needs: Not on file  Physical Activity: Not on file  Stress: Not on file  Social Connections: Not on file  Intimate Partner Violence: Not on file    Outpatient Medications Prior to Visit  Medication Sig Dispense Refill   amLODipine (NORVASC) 10 MG tablet TAKE 1 TABLET(10 MG) BY MOUTH DAILY 90 tablet 3   lisinopril (ZESTRIL) 30 MG tablet TAKE 1 TABLET(30 MG) BY MOUTH DAILY 90 tablet 3   metoprolol succinate (TOPROL-XL) 50 MG 24 hr tablet TAKE 1 TABLET BY MOUTH EVERY DAY, TAKE WITH A MEAL OR FOLLOWING A MEAL 90 tablet 3   hydrocortisone cream 0.5 % Apply 1 application topically 2 (two) times daily. 30 g 6   No facility-administered medications prior to visit.    No Known Allergies  ROS Review of Systems    Objective:    Physical Exam Constitutional:      Appearance: She is normal weight.  HENT:     Head: Normocephalic and atraumatic.  Cardiovascular:     Rate and Rhythm: Normal rate and regular rhythm.     Pulses: Normal pulses.     Heart sounds: Normal heart  sounds.  Musculoskeletal:     Cervical back: Normal range of motion.  Neurological:     Mental Status: She is alert.    BP (!) 161/82   Temp 98.2 F (36.8 C)   Ht 5\' 1"  (1.549 m)   Wt 107 lb 0.2 oz (48.5 kg)   BMI 20.22 kg/m  Wt Readings from Last 3 Encounters:  09/30/20 107 lb 0.2 oz (48.5 kg)  10/02/19 112 lb (50.8 kg)  04/03/19 115 lb 3.2 oz (52.3 kg)     Health Maintenance Due  Topic Date Due   MAMMOGRAM  Never done   PAP SMEAR-Modifier  12/25/2019   INFLUENZA VACCINE  09/26/2020    There are no preventive  care reminders to display for this patient.  Lab Results  Component Value Date   TSH 0.985 10/02/2019   Lab Results  Component Value Date   WBC 7.2 10/02/2019   HGB 12.3 10/02/2019   HCT 37.8 10/02/2019   MCV 80 10/02/2019   PLT 286 10/02/2019   Lab Results  Component Value Date   NA 139 10/02/2019   K 3.9 10/02/2019   CO2 20 10/02/2019   GLUCOSE 109 (H) 10/02/2019   BUN 14 10/02/2019   CREATININE 0.73 10/02/2019   BILITOT 0.7 10/02/2019   ALKPHOS 65 10/02/2019   AST 20 10/02/2019   ALT 21 10/02/2019   PROT 8.0 10/02/2019   ALBUMIN 5.0 (H) 10/02/2019   CALCIUM 9.6 10/02/2019   ANIONGAP 9 12/17/2015   Lab Results  Component Value Date   CHOL 244 (H) 10/02/2019   Lab Results  Component Value Date   HDL 76 10/02/2019   Lab Results  Component Value Date   LDLCALC 141 (H) 10/02/2019   Lab Results  Component Value Date   TRIG 155 (H) 10/02/2019   Lab Results  Component Value Date   CHOLHDL 3.2 10/02/2019   Lab Results  Component Value Date   HGBA1C 5.7 (A) 04/03/2019      Assessment & Plan:   Problem List Items Addressed This Visit       Other   History of alcohol dependence (HCC) (Chronic) Stable 2 beers only reported   Other Visit Diagnoses     Essential hypertension    -  Primary Persistent  Encouraged on going compliance with current medication regimen Encouraged home monitoring and recording BP <130/80 Eating a heart-healthy diet with less salt Encouraged regular physical activity   Relevant Orders   Urinalysis Dipstick (Completed)   Comp. Metabolic Panel (12) (Completed)   Abnormal urinalysis       Relevant Orders   Urine Culture   Healthcare maintenance       Need for 23-polyvalent pneumococcal polysaccharide vaccine       Screening for diabetes mellitus (DM)       Screening for cholesterol level       Relevant Orders   Lipid panel (Completed)   Prediabetes Encouraged brown rice and light beer Consider home glucose  monitoring Continue regular daily exercise Encourage blood pressure control goal <120/80 and maintaining total cholesterol <200 Follow-up every 3 to 6 months for reevaluation Education material provided     No orders of the defined types were placed in this encounter.   Follow-up: Return in about 6 months (around 04/02/2021) for Follow up HTN 05/31/2021.    [id number], NP

## 2020-09-30 NOTE — Patient Instructions (Addendum)
Prediabetes Eating Plan Prediabetes is a condition that causes blood sugar (glucose) levels to be higher than normal. This increases the risk for developing type 2 diabetes (type 2 diabetes mellitus). Working with a health care provider or nutrition specialist (dietitian) to make diet and lifestyle changes can help prevent the onset of diabetes. These changes may help you: Control your blood glucose levels. Improve your cholesterol levels. Manage your blood pressure. What are tips for following this plan? Reading food labels Read food labels to check the amount of fat, salt (sodium), and sugar in prepackaged foods. Avoid foods that have: Saturated fats. Trans fats. Added sugars. Avoid foods that have more than 300 milligrams (mg) of sodium per serving. Limit your sodium intake to less than 2,300 mg each day. Shopping Avoid buying pre-made and processed foods. Avoid buying drinks with added sugar. Cooking Cook with olive oil. Do not use butter, lard, or ghee. Bake, broil, grill, steam, or boil foods. Avoid frying. Meal planning  Work with your dietitian to create an eating plan that is right for you. This may include tracking how many calories you take in each day. Use a food diary, notebook, or mobile application to track what you eat at each meal. Consider following a Mediterranean diet. This includes: Eating several servings of fresh fruits and vegetables each day. Eating fish at least twice a week. Eating one serving each day of whole grains, beans, nuts, and seeds. Using olive oil instead of other fats. Limiting alcohol. Limiting red meat. Using nonfat or low-fat dairy products. Consider following a plant-based diet. This includes dietary choices that focus on eating mostly vegetables and fruit, grains, beans, nuts, and seeds. If you have high blood pressure, you may need to limit your sodium intake or follow a diet such as the DASH (Dietary Approaches to Stop Hypertension) eating  plan. The DASH diet aims to lower high blood pressure.  Lifestyle Set weight loss goals with help from your health care team. It is recommended that most people with prediabetes lose 7% of their body weight. Exercise for at least 30 minutes 5 or more days a week. Attend a support group or seek support from a mental health counselor. Take over-the-counter and prescription medicines only as told by your health care provider. What foods are recommended? Fruits Berries. Bananas. Apples. Oranges. Grapes. Papaya. Mango. Pomegranate. Kiwi.Grapefruit. Cherries. Vegetables Lettuce. Spinach. Peas. Beets. Cauliflower. Cabbage. Broccoli. Carrots.Tomatoes. Squash. Eggplant. Herbs. Peppers. Onions. Cucumbers. Brussels sprouts. Grains Whole grains, such as whole-wheat or whole-grain breads, crackers, cereals, and pasta. Unsweetened oatmeal. Bulgur. Barley. Quinoa. Brown rice. Corn orwhole-wheat flour tortillas or taco shells. Meats and other proteins Seafood. Poultry without skin. Lean cuts of pork and beef. Tofu. Eggs. Nuts.Beans. Dairy Low-fat or fat-free dairy products, such as yogurt, cottage cheese, and cheese. Beverages Water. Tea. Coffee. Sugar-free or diet soda. Seltzer water. Low-fat or nonfatmilk. Milk alternatives, such as soy or almond milk. Fats and oils Olive oil. Canola oil. Sunflower oil. Grapeseed oil. Avocado. Walnuts. Sweets and desserts Sugar-free or low-fat pudding. Sugar-free or low-fat ice cream and other frozentreats. Seasonings and condiments Herbs. Sodium-free spices. Mustard. Relish. Low-salt, low-sugar ketchup.Low-salt, low-sugar barbecue sauce. Low-fat or fat-free mayonnaise. The items listed above may not be a complete list of recommended foods and beverages. Contact a dietitian for more information. What foods are not recommended? Fruits Fruits canned with syrup. Vegetables Canned vegetables. Frozen vegetables with butter or cream sauce. Grains Refined white flour and  flour products, such as bread, pasta, snack  foods, andcereals. Meats and other proteins Fatty cuts of meat. Poultry with skin. Breaded or fried meat. Processed meats. Dairy Full-fat yogurt, cheese, or milk. Beverages Sweetened drinks, such as iced tea and soda. Fats and oils Butter. Lard. Ghee. Sweets and desserts Baked goods, such as cake, cupcakes, pastries, cookies, and cheesecake. Seasonings and condiments Spice mixes with added salt. Ketchup. Barbecue sauce. Mayonnaise. The items listed above may not be a complete list of foods and beverages that are not recommended. Contact a dietitian for more information. Where to find more information American Diabetes Association: www.diabetes.org Summary You may need to make diet and lifestyle changes to help prevent the onset of diabetes. These changes can help you control blood sugar, improve cholesterol levels, and manage blood pressure. Set weight loss goals with help from your health care team. It is recommended that most people with prediabetes lose 7% of their body weight. Consider following a Mediterranean diet. This includes eating plenty of fresh fruits and vegetables, whole grains, beans, nuts, seeds, fish, and low-fat dairy, and using olive oil instead of other fats. This information is not intended to replace advice given to you by your health care provider. Make sure you discuss any questions you have with your healthcare provider. Document Revised: 05/14/2019 Document Reviewed: 05/14/2019 Elsevier Patient Education  2022 [employer]  Chickenpox, Adult Chickenpox is an infection that is caused by a virus (viral infection). It causes fever and then an itchy rash that turns into blisters, which eventually change into scabs. Chickenpox spreads easily from person to person (is contagious). It starts to be contagious 1-2 days before the rash appears. It remainscontagious until the blisters become crusted. Chickenpox can be very  serious for adults. If you have had chickenpox once, youprobably will not get it again. What are the causes? This condition is caused by the varicella-zoster virus. You may get the virus by: Breathing in droplets from an infected person's cough or sneeze. Having contact with fluids from the chickenpox rash. Touching something that has been exposed to the virus (has been contaminated) and then touching your mouth, nose, or eyes. What increases the risk? This condition is more likely to develop in: People who have never had chickenpox. People who have never been vaccinated against chickenpox. Health care workers. People who live in or spend time in crowded places. These may include: College students. Teachers. People in the [employer]. People who live in an institution, such as a prison. People who have a weakened disease-fighting system (immunesystem). You may have a weakened immune system if you: Have HIV (human immunodeficiency virus). Have AIDS (acquired immunodeficiency syndrome). Have cancer. Are receiving chemotherapy. Are taking medicines that reduce (suppress) the activity of the immune system. What are the signs or symptoms? Symptoms of chickenpox are usually worse in adults than they are in children. Symptoms may include: An itchy rash that changes over time. The rash starts as red spots, which then become bumps. The bumps turn into fluid-filled blisters. The blisters crust and turn into scabs, usually about 3-7 days after the rash started. Body aches and pains. Headache. Tiredness. Fever. Symptoms develop 10-21 days after a person has been exposed to the virus. Possible complications of chickenpox include: Skin infection. Lung infection (pneumonia). Brain infection or swelling (encephalitis). A bloodstream infection (sepsis). Bleeding problems. Dehydration. Problems with balance and muscle control (cerebellar ataxia). Having a baby with a birth defect, if you are  pregnant. How is this diagnosed? This condition may be diagnosed based on: Your symptoms.  Your medical history. A physical exam. Blood tests. Testing a fluid sample (culture) from the rash. How is this treated? Treatment for this condition may include: Taking medicine to shorten the illness and make it less severe. Applying calamine lotion to relieve itchiness. Using baking soda or dry oatmeal baths to soothe itchy skin. Using a medicine that reduces itching (antihistamine). Taking antibiotic medicines if you also develop a bacterial infection. Antibiotics do notcure viral infections such as chickenpox. Follow these instructions at home: Medicines Take or apply over-the-counter and prescription medicines only as told by your health care provider. These include any antihistamines and anti-itch creams. If you were prescribed an antibiotic medicine, take it as told by your health care provider. Do not stop using the antibiotic even if you start to feel better. Relieving pain, itching, and discomfort  Try to stay cool and out of the sun. Sweating and being hot can make itching worse. Cool baths can be soothing. Try adding baking soda or dry oatmeal to the water to reduce itching. Do not bathe in hot water. Put cold, wet cloths (cold compresses) on itchy areas as told by your health care provider. Use calamine lotion as recommended by your health care provider. This is an over-the-counter lotion that helps to relieve itchiness. Do not eat or drink foods and beverages that are spicy, salty, or acidic if you have blisters in your mouth. Soft, bland, and cold foods and beverages are easiest to swallow. Do not scratch or pick at the rash. To help avoid scratching: Keep your fingernails clean and cut short. Wear gloves or mittens while you sleep, if scratching is a problem.  Preventing infection  You start to be contagious 1-2 days before the rash appears. You remain contagious until your  blisters become crusted. While you are contagious, avoid being around: Pregnant women. Infants. People receiving cancer treatments or long-term steroids. People with weakened immune systems. Older people. Anyone who has not had chickenpox before. Anyone who has not been vaccinated for chickenpox. Stay home from work or school until all blisters have crusted and new spots stop appearing, or for as long as told by your health care provider. If someone has been exposed to your chickenpox, contact a health care provider to see if that person needs vaccination. Wash your hands often with soap and water for at least 20 seconds. If soap and water are not available, use hand sanitizer. Have others in your household also wash their hands often. Doing this: Lowers your chance of getting a bacterial skin infection. Lowers the chance that chickenpox will spread to others.  General instructions Drink enough fluid to keep your urine pale yellow. Rest as needed. Keep all follow-up visits. This is important. How is this prevented? Getting vaccinated is the best way to prevent chickenpox. If you have not been vaccinated, talk with your health care provider about getting the vaccine. Where to find more information Centers for Disease Control and Prevention: [employer]Ex[employer].com.br Contact a health care provider if: You have a fever. You have yellowish-white fluid coming from your blisters. You have areas of skin that become red or tender or feel warm to the touch. You develop a cough. Your urine is a darker color than usual. Get help right away if: You cannot stop vomiting. You feel confused. You are very sleepy. You have any of these problems: A stiff neck. A severe headache. Severe joint pain or stiffness. Trouble walking or keeping your balance. A seizure. Fast breathing. Trouble breathing. Chest  pain. Eye pain, red eyes, or difficulty seeing. Blood in your urine or stool (feces). You start getting  many bruises on your skin. Your blisters bleed. You develop blisters in your eye. You have a fever and your symptoms suddenly get worse. Summary Chickenpox is an infection that is caused by a virus (viral infection). It causes fever and then an itchy rash that turns into blisters, which eventually change into scabs. Chickenpox spreads easily from person to person (is contagious) 1-2 days before the rash appears. It remains contagious until the blisters become crusted. Getting vaccinated is the best way to prevent chickenpox. This information is not intended to replace advice given to you by your health care provider. Make sure you discuss any questions you have with your healthcare provider. Document Revised: 02/08/2020 Document Reviewed: 02/08/2020 Elsevier Patient Education  2022 [employer].

## 2020-10-01 LAB — COMP. METABOLIC PANEL (12)
AST: 21 IU/L (ref 0–40)
Albumin/Globulin Ratio: 1.8 (ref 1.2–2.2)
Albumin: 4.9 g/dL — ABNORMAL HIGH (ref 3.8–4.8)
Alkaline Phosphatase: 70 IU/L (ref 44–121)
BUN/Creatinine Ratio: 17 (ref 12–28)
BUN: 12 mg/dL (ref 8–27)
Bilirubin Total: 0.7 mg/dL (ref 0.0–1.2)
Calcium: 9.7 mg/dL (ref 8.7–10.3)
Chloride: 102 mmol/L (ref 96–106)
Creatinine, Ser: 0.71 mg/dL (ref 0.57–1.00)
Globulin, Total: 2.8 g/dL (ref 1.5–4.5)
Glucose: 90 mg/dL (ref 65–99)
Potassium: 4 mmol/L (ref 3.5–5.2)
Sodium: 140 mmol/L (ref 134–144)
Total Protein: 7.7 g/dL (ref 6.0–8.5)
eGFR: 94 mL/min/{1.73_m2} (ref >59)

## 2020-10-01 LAB — LIPID PANEL
Chol/HDL Ratio: 2.6 ratio (ref 0.0–4.4)
Cholesterol, Total: 198 mg/dL (ref 100–199)
HDL: 77 mg/dL (ref >39)
LDL Chol Calc (NIH): 92 mg/dL (ref 0–99)
Triglycerides: 170 mg/dL — ABNORMAL HIGH (ref 0–149)
VLDL Cholesterol Cal: 29 mg/dL (ref 5–40)

## 2020-10-03 LAB — URINE CULTURE: Organism ID, Bacteria: NO GROWTH

## 2021-03-31 ENCOUNTER — Encounter: Payer: Self-pay | Admitting: Nurse Practitioner

## 2021-03-31 ENCOUNTER — Ambulatory Visit (INDEPENDENT_AMBULATORY_CARE_PROVIDER_SITE_OTHER): Payer: Medicare Other | Admitting: Nurse Practitioner

## 2021-03-31 ENCOUNTER — Other Ambulatory Visit: Payer: Self-pay

## 2021-03-31 VITALS — BP 130/75 | HR 100 | Temp 98.3°F | Ht 61.0 in | Wt 110.2 lb

## 2021-03-31 DIAGNOSIS — E1169 Type 2 diabetes mellitus with other specified complication: Secondary | ICD-10-CM | POA: Diagnosis not present

## 2021-03-31 DIAGNOSIS — R7303 Prediabetes: Secondary | ICD-10-CM

## 2021-03-31 DIAGNOSIS — I10 Essential (primary) hypertension: Secondary | ICD-10-CM

## 2021-03-31 DIAGNOSIS — Z13820 Encounter for screening for osteoporosis: Secondary | ICD-10-CM

## 2021-03-31 DIAGNOSIS — E782 Mixed hyperlipidemia: Secondary | ICD-10-CM

## 2021-03-31 MED ORDER — LISINOPRIL 30 MG PO TABS: ORAL_TABLET | ORAL | 1 refills | Status: DC

## 2021-03-31 MED ORDER — AMLODIPINE BESYLATE 10 MG PO TABS: ORAL_TABLET | ORAL | 1 refills | Status: DC

## 2021-03-31 MED ORDER — METOPROLOL SUCCINATE ER 50 MG PO TB24: ORAL_TABLET | ORAL | 1 refills | Status: DC

## 2021-03-31 NOTE — Patient Instructions (Addendum)
[facility] [phone]   Managing Your Hypertension Hypertension, also called high blood pressure, is when the force of the blood pressing against the walls of the arteries is too strong. Arteries are blood vessels that carry blood from your heart throughout your body. Hypertension forces the heart to work harder to pump blood and may cause the arteries to become narrow or stiff. Understanding blood pressure readings Your personal target blood pressure may vary depending on your medical conditions, your age, and other factors. A blood pressure reading includes a higher number over a lower number. Ideally, your blood pressure should be below 120/80. You should know that: The first, or top, number is called the systolic pressure. It is a measure of the pressure in your arteries as your heart beats. The second, or bottom number, is called the diastolic pressure. It is a measure of the pressure in your arteries as the heart relaxes. Blood pressure is classified into four stages. Based on your blood pressure reading, your health care provider may use the following stages to determine what type of treatment you need, if any. Systolic pressure and diastolic pressure are measured in a unit called mmHg. Normal Systolic pressure: below 123456. Diastolic pressure: below 80. Elevated Systolic pressure: [id number]. Diastolic pressure: below 80. Hypertension stage 1 Systolic pressure: 0000000. Diastolic pressure: [id number]. Hypertension stage 2 Systolic pressure: [id number] or above. Diastolic pressure: 90 or above. How can this condition affect me? Managing your hypertension is an important responsibility. Over time, hypertension can damage the arteries and decrease blood flow to important parts of the body, including the brain, heart, and kidneys. Having untreated or uncontrolled hypertension can lead to: A heart attack. A stroke. A weakened blood vessel (aneurysm). Heart  failure. Kidney damage. Eye damage. Metabolic syndrome. Memory and concentration problems. Vascular dementia. What actions can I take to manage this condition? Hypertension can be managed by making lifestyle changes and possibly by taking medicines. Your health care provider will help you make a plan to bring your blood pressure within a normal range. Nutrition  Eat a diet that is high in fiber and potassium, and low in salt (sodium), added sugar, and fat. An example eating plan is called the Dietary Approaches to Stop Hypertension (DASH) diet. To eat this way: Eat plenty of fresh fruits and vegetables. Try to fill one-half of your plate at each meal with fruits and vegetables. Eat whole grains, such as whole-wheat pasta, brown rice, or whole-grain bread. Fill about one-fourth of your plate with whole grains. Eat low-fat dairy products. Avoid fatty cuts of meat, processed or cured meats, and poultry with skin. Fill about one-fourth of your plate with lean proteins such as fish, chicken without skin, beans, eggs, and tofu. Avoid pre-made and processed foods. These tend to be higher in sodium, added sugar, and fat. Reduce your daily sodium intake. Most people with hypertension should eat less than 1,500 mg of sodium a day. Lifestyle  Work with your health care provider to maintain a healthy body weight or to lose weight. Ask what an ideal weight is for you. Get at least 30 minutes of exercise that causes your heart to beat faster (aerobic exercise) most days of the week. Activities may include walking, swimming, or biking. Include exercise to strengthen your muscles (resistance exercise), such as weight lifting, as part of your weekly exercise routine. Try to do these types of exercises for 30 minutes at least 3 days a week. Do not use any  products that contain nicotine or tobacco, such as cigarettes, e-cigarettes, and chewing tobacco. If you need help quitting, ask your health care  provider. Control any long-term (chronic) conditions you have, such as high cholesterol or diabetes. Identify your sources of stress and find ways to manage stress. This may include meditation, deep breathing, or making time for fun activities. Alcohol use Do not drink alcohol if: Your health care provider tells you not to drink. You are pregnant, may be pregnant, or are planning to become pregnant. If you drink alcohol: Limit how much you use to: 0-1 drink a day for women. 0-2 drinks a day for men. Be aware of how much alcohol is in your drink. In the [place] one drink equals one 12 oz bottle of beer (355 mL), one 5 oz glass of wine (148 mL), or one 1 oz glass of hard liquor (44 mL). Medicines Your health care provider may prescribe medicine if lifestyle changes are not enough to get your blood pressure under control and if: Your systolic blood pressure is 130 or higher. Your diastolic blood pressure is 80 or higher. Take medicines only as told by your health care provider. Follow the directions carefully. Blood pressure medicines must be taken as told by your health care provider. The medicine does not work as well when you skip doses. Skipping doses also puts you at risk for problems. Monitoring Before you monitor your blood pressure: Do not smoke, drink caffeinated beverages, or exercise within 30 minutes before taking a measurement. Use the bathroom and empty your bladder (urinate). Sit quietly for at least 5 minutes before taking measurements. Monitor your blood pressure at home as told by your health care provider. To do this: Sit with your back straight and supported. Place your feet flat on the floor. Do not cross your legs. Support your arm on a flat surface, such as a table. Make sure your upper arm is at heart level. Each time you measure, take two or three readings one minute apart and record the results. You may also need to have your blood pressure checked regularly by your  health care provider. General information Talk with your health care provider about your diet, exercise habits, and other lifestyle factors that may be contributing to hypertension. Review all the medicines you take with your health care provider because there may be side effects or interactions. Keep all visits as told by your health care provider. Your health care provider can help you create and adjust your plan for managing your high blood pressure. Where to find more information National Heart, Lung, and Blood Institute: https://wilson-eaton.com/ American Heart Association: www.heart.org Contact a health care provider if: You think you are having a reaction to medicines you have taken. You have repeated (recurrent) headaches. You feel dizzy. You have swelling in your ankles. You have trouble with your vision. Get help right away if: You develop a severe headache or confusion. You have unusual weakness or numbness, or you feel faint. You have severe pain in your chest or abdomen. You vomit repeatedly. You have trouble breathing. These symptoms may represent a serious problem that is an emergency. Do not wait to see if the symptoms will go away. Get medical help right away. Call your local emergency services (911 in the [place] Do not drive yourself to the hospital. Summary Hypertension is when the force of blood pumping through your arteries is too strong. If this condition is not controlled, it may put you at risk for serious  complications. Your personal target blood pressure may vary depending on your medical conditions, your age, and other factors. For most people, a normal blood pressure is less than 120/80. Hypertension is managed by lifestyle changes, medicines, or both. Lifestyle changes to help manage hypertension include losing weight, eating a healthy, low-sodium diet, exercising more, stopping smoking, and limiting alcohol. This information is not intended to replace advice given to  you by your health care provider. Make sure you discuss any questions you have with your health care provider. Document Revised: 03/02/2019 Document Reviewed: 01/13/2019 Elsevier Patient Education  2022 [employer].

## 2021-03-31 NOTE — Progress Notes (Signed)
[address] [place], [place]  [address] Phone:  [phone]   Fax:  [phone]   Established Patient Office Visit  Subjective:  Patient ID: Molly Rodriguez, female    DOB: November 09, 1955  Age: 66 y.o. MRN: [id number]  CC:  Chief Complaint  Patient presents with   Follow-up    Pt is here today with her daughter for her 6 month follow up visit. No concerns or issues to discuss.    HPI Shawne Bulow presents for follow up. She  has a past medical history of Hypercholesteremia and Hypertension.   Hypertension Patient is here for follow-up of elevated blood pressure. She is not walking as much due to winter weather and is adherent to a low-salt diet. Blood pressure is not monitored at home. Cardiac symptoms:none.  Patient denies chest pain, dyspnea, exertional chest pressure/discomfort, fatigue, irregular heart beat, lower extremity edema, near-syncope. Cardiovascular risk factors: none. Use of agents associated with hypertension: none. History of target organ damage: none Past Medical History:  Diagnosis Date   Hypercholesteremia    Hypertension     History reviewed. No pertinent surgical history.  History reviewed. No pertinent family history.  Social History   Socioeconomic History   Marital status: Single    Spouse name: Not on file   Number of children: Not on file   Years of education: Not on file   Highest education level: Not on file  Occupational History   Not on file  Tobacco Use   Smoking status: Never   Smokeless tobacco: Never  Vaping Use   Vaping Use: Never used  Substance and Sexual Activity   Alcohol use: Yes    Alcohol/week: 2.0 standard drinks    Types: 2 Cans of beer per week    Comment: daily   Drug use: No   Sexual activity: Not Currently    Birth control/protection: Post-menopausal  Other Topics Concern   Not on file  Social History Narrative   Not on file   Social Determinants of Health   Financial Resource  Strain: Not on file  Food Insecurity: Not on file  Transportation Needs: Not on file  Physical Activity: Not on file  Stress: Not on file  Social Connections: Not on file  Intimate Partner Violence: Not on file    Outpatient Medications Prior to Visit  Medication Sig Dispense Refill   amLODipine (NORVASC) 10 MG tablet TAKE 1 TABLET(10 MG) BY MOUTH DAILY 90 tablet 3   lisinopril (ZESTRIL) 30 MG tablet TAKE 1 TABLET(30 MG) BY MOUTH DAILY 90 tablet 3   metoprolol succinate (TOPROL-XL) 50 MG 24 hr tablet TAKE 1 TABLET BY MOUTH EVERY DAY, TAKE WITH A MEAL OR FOLLOWING A MEAL 90 tablet 3   No facility-administered medications prior to visit.    No Known Allergies  ROS Review of Systems    Objective:    Physical Exam Constitutional:      General: She is not in acute distress.    Appearance: She is normal weight. She is not toxic-appearing.  HENT:     Head: Normocephalic and atraumatic.     Nose: Nose normal.     Mouth/Throat:     Mouth: Mucous membranes are moist.  Cardiovascular:     Rate and Rhythm: Normal rate and regular rhythm.     Pulses: Normal pulses.     Heart sounds: Normal heart sounds.  Pulmonary:     Effort: Pulmonary effort is normal.  Breath sounds: Normal breath sounds.  Abdominal:     General: Abdomen is flat. Bowel sounds are normal.     Palpations: Abdomen is soft.  Musculoskeletal:        General: Normal range of motion.     Cervical back: Normal range of motion.     Right lower leg: No edema.     Left lower leg: No edema.  Skin:    General: Skin is warm and dry.     Capillary Refill: Capillary refill takes less than 2 seconds.  Neurological:     General: No focal deficit present.     Mental Status: She is alert and oriented to person, place, and time.  Psychiatric:        Mood and Affect: Mood normal.        Behavior: Behavior normal.        Thought Content: Thought content normal.    BP 130/75    Pulse 100    Temp 98.3 F (36.8 C)    Ht  $R'5\' 1"'Ql$  (1.549 m)    Wt 110 lb 3.2 oz (50 kg)    SpO2 98%    BMI 20.82 kg/m  Wt Readings from Last 3 Encounters:  03/31/21 110 lb 3.2 oz (50 kg)  09/30/20 107 lb 0.2 oz (48.5 kg)  10/02/19 112 lb (50.8 kg)     There are no preventive care reminders to display for this patient.   There are no preventive care reminders to display for this patient.  Lab Results  Component Value Date   TSH 0.985 10/02/2019   Lab Results  Component Value Date   WBC 7.2 10/02/2019   HGB 12.3 10/02/2019   HCT 37.8 10/02/2019   MCV 80 10/02/2019   PLT 286 10/02/2019   Lab Results  Component Value Date   NA 140 09/30/2020   K 4.0 09/30/2020   CO2 20 10/02/2019   GLUCOSE 90 09/30/2020   BUN 12 09/30/2020   CREATININE 0.71 09/30/2020   BILITOT 0.7 09/30/2020   ALKPHOS 70 09/30/2020   AST 21 09/30/2020   ALT 21 10/02/2019   PROT 7.7 09/30/2020   ALBUMIN 4.9 (H) 09/30/2020   CALCIUM 9.7 09/30/2020   ANIONGAP 9 12/17/2015   EGFR 94 09/30/2020   Lab Results  Component Value Date   CHOL 198 09/30/2020   Lab Results  Component Value Date   HDL 77 09/30/2020   Lab Results  Component Value Date   LDLCALC 92 09/30/2020   Lab Results  Component Value Date   TRIG 170 (H) 09/30/2020   Lab Results  Component Value Date   CHOLHDL 2.6 09/30/2020   Lab Results  Component Value Date   HGBA1C 5.7 (A) 04/03/2019      Assessment & Plan:   Problem List Items Addressed This Visit   None Visit Diagnoses     Essential hypertension    -  Primary Stable  Encouraged on going compliance with current medication regimen Encouraged home monitoring and recording BP <130/80 Eating a heart-healthy diet with less salt Encouraged regular physical activity     Relevant Medications   amLODipine (NORVASC) 10 MG tablet   lisinopril (ZESTRIL) 30 MG tablet   metoprolol succinate (TOPROL-XL) 50 MG 24 hr tablet   Other Relevant Orders   Comp. Metabolic Panel (12) (Completed)   Dyslipidemia with low  high density lipoprotein (HDL) cholesterol with hypertriglyceridemia due to type 2 diabetes mellitus (HCC)     Controlled  Diet  controlled    Relevant Medications   lisinopril (ZESTRIL) 30 MG tablet   Other Relevant Orders   Lipid panel (Completed)   Prediabetes       Relevant Orders   POCT glycosylated hemoglobin (Hb A1C)   Screening for osteoporosis       Relevant Orders   HM DEXA SCAN (Completed)       Meds ordered this encounter  Medications   amLODipine (NORVASC) 10 MG tablet    Sig: TAKE 1 TABLET(10 MG) BY MOUTH DAILY    Dispense:  90 tablet    Refill:  1    Order Specific Question:   Supervising Provider    Answer:   Tresa Garter [[id number]]   lisinopril (ZESTRIL) 30 MG tablet    Sig: TAKE 1 TABLET(30 MG) BY MOUTH DAILY    Dispense:  90 tablet    Refill:  1    Order Specific Question:   Supervising Provider    Answer:   Tresa Garter [[id number]]   metoprolol succinate (TOPROL-XL) 50 MG 24 hr tablet    Sig: TAKE 1 TABLET BY MOUTH EVERY DAY, TAKE WITH A MEAL OR FOLLOWING A MEAL    Dispense:  90 tablet    Refill:  1    Order Specific Question:   Supervising Provider    AnswerTresa Garter [id number]    Follow-up: Return in about 6 months (around 09/28/2021) for Follow up HTN [id number].    Vevelyn Francois, NP

## 2021-04-01 LAB — LIPID PANEL
Chol/HDL Ratio: 2.7 ratio (ref 0.0–4.4)
Cholesterol, Total: 225 mg/dL — ABNORMAL HIGH (ref 100–199)
HDL: 82 mg/dL (ref >39)
LDL Chol Calc (NIH): 118 mg/dL — ABNORMAL HIGH (ref 0–99)
Triglycerides: 145 mg/dL (ref 0–149)
VLDL Cholesterol Cal: 25 mg/dL (ref 5–40)

## 2021-04-01 LAB — COMP. METABOLIC PANEL (12)
AST: 20 IU/L (ref 0–40)
Albumin/Globulin Ratio: 1.7 (ref 1.2–2.2)
Albumin: 5 g/dL — ABNORMAL HIGH (ref 3.8–4.8)
Alkaline Phosphatase: 66 IU/L (ref 44–121)
BUN/Creatinine Ratio: 28 (ref 12–28)
BUN: 18 mg/dL (ref 8–27)
Bilirubin Total: 0.5 mg/dL (ref 0.0–1.2)
Calcium: 9.5 mg/dL (ref 8.7–10.3)
Chloride: 105 mmol/L (ref 96–106)
Creatinine, Ser: 0.65 mg/dL (ref 0.57–1.00)
Globulin, Total: 3 g/dL (ref 1.5–4.5)
Glucose: 94 mg/dL (ref 70–99)
Potassium: 4.4 mmol/L (ref 3.5–5.2)
Sodium: 143 mmol/L (ref 134–144)
Total Protein: 8 g/dL (ref 6.0–8.5)
eGFR: 98 mL/min/{1.73_m2} (ref >59)

## 2021-09-29 ENCOUNTER — Ambulatory Visit: Payer: Medicare Other | Admitting: Nurse Practitioner

## 2021-09-29 ENCOUNTER — Ambulatory Visit (INDEPENDENT_AMBULATORY_CARE_PROVIDER_SITE_OTHER): Payer: Medicare Other | Admitting: Nurse Practitioner

## 2021-09-29 ENCOUNTER — Encounter: Payer: Self-pay | Admitting: Nurse Practitioner

## 2021-09-29 VITALS — BP 133/87 | HR 98 | Temp 98.1°F | Ht 61.0 in | Wt 108.8 lb

## 2021-09-29 DIAGNOSIS — I10 Essential (primary) hypertension: Secondary | ICD-10-CM

## 2021-09-29 DIAGNOSIS — I159 Secondary hypertension, unspecified: Secondary | ICD-10-CM

## 2021-09-29 NOTE — Assessment & Plan Note (Signed)
-   CBC - Comprehensive metabolic panel   Follow up:  Follow up in 6 months or sooner if needed 

## 2021-09-29 NOTE — Patient Instructions (Signed)
1. Essential hypertension  - CBC - Comprehensive metabolic panel   Follow up:  Follow up in 6 months or sooner if needed

## 2021-09-29 NOTE — Progress Notes (Signed)
@  Patient ID: Molly Rodriguez, female    DOB: 1955-07-24, 66 y.o.   MRN: [id number]  Chief Complaint  Patient presents with   Hypertension    Pt is here for 6 month's BP follow up visit.    Referring provider: Barbette Merino, NP   HPI  Molly Rodriguez presents for follow up. She  has a past medical history of Hypercholesteremia and Hypertension.   Patient presents today for a follow-up on hypertension.  Overall patient states that she has been doing well.  She has no new issues or concerns today.  She does not currently need refills on her medications.  She states she is compliant with medications for cholesterol and low-salt diet.  Patient's daughter is translating for her today. Denies f/c/s, n/v/d, hemoptysis, PND, leg swelling Denies chest pain or edema     No Known Allergies  Immunization History  Administered Date(s) Administered   Influenza Whole 11/06/2010   Influenza,inj,Quad PF,6+ Mos 11/14/2015, 12/24/2016, 12/25/2017   PFIZER(Purple Top)SARS-COV-2 Vaccination 02/28/2019, 03/21/2019   Pneumococcal Polysaccharide-23 09/30/2020   Tdap 08/23/2016    Past Medical History:  Diagnosis Date   Hypercholesteremia    Hypertension     Tobacco History: Social History   Tobacco Use  Smoking Status Never  Smokeless Tobacco Never   Counseling given: Not Answered   Outpatient Encounter Medications as of 09/29/2021  Medication Sig   amLODipine (NORVASC) 10 MG tablet TAKE 1 TABLET(10 MG) BY MOUTH DAILY   lisinopril (ZESTRIL) 30 MG tablet TAKE 1 TABLET(30 MG) BY MOUTH DAILY   metoprolol succinate (TOPROL-XL) 50 MG 24 hr tablet TAKE 1 TABLET BY MOUTH EVERY DAY, TAKE WITH A MEAL OR FOLLOWING A MEAL   No facility-administered encounter medications on file as of 09/29/2021.     Review of Systems  Review of Systems  Constitutional: Negative.   HENT: Negative.    Cardiovascular: Negative.   Gastrointestinal: Negative.   Allergic/Immunologic: Negative.   Neurological: Negative.    Psychiatric/Behavioral: Negative.         Physical Exam  BP 133/87 (BP Location: Left Arm, Patient Position: Sitting, Cuff Size: Normal)   Pulse 98   Temp 98.1 F (36.7 C)   Ht 5\' 1"  (1.549 m)   Wt 108 lb 12.8 oz (49.4 kg)   SpO2 100%   BMI 20.56 kg/m   Wt Readings from Last 5 Encounters:  09/29/21 108 lb 12.8 oz (49.4 kg)  03/31/21 110 lb 3.2 oz (50 kg)  09/30/20 107 lb 0.2 oz (48.5 kg)  10/02/19 112 lb (50.8 kg)  04/03/19 115 lb 3.2 oz (52.3 kg)     Physical Exam Vitals and nursing note reviewed.  Constitutional:      General: She is not in acute distress.    Appearance: She is well-developed.  Cardiovascular:     Rate and Rhythm: Normal rate and regular rhythm.  Pulmonary:     Effort: Pulmonary effort is normal.     Breath sounds: Normal breath sounds.  Neurological:     Mental Status: She is alert and oriented to person, place, and time.      Assessment & Plan:   Hypertension - CBC - Comprehensive metabolic panel   Follow up:  Follow up in 6 months or sooner if needed     06/01/19, NP 09/29/2021

## 2021-09-30 LAB — COMPREHENSIVE METABOLIC PANEL
ALT: 18 IU/L (ref 0–32)
AST: 21 IU/L (ref 0–40)
Albumin/Globulin Ratio: 1.6 (ref 1.2–2.2)
Albumin: 4.6 g/dL (ref 3.9–4.9)
Alkaline Phosphatase: 61 IU/L (ref 44–121)
BUN/Creatinine Ratio: 18 (ref 12–28)
BUN: 13 mg/dL (ref 8–27)
Bilirubin Total: 0.5 mg/dL (ref 0.0–1.2)
CO2: 23 mmol/L (ref 20–29)
Calcium: 9.4 mg/dL (ref 8.7–10.3)
Chloride: 104 mmol/L (ref 96–106)
Creatinine, Ser: 0.73 mg/dL (ref 0.57–1.00)
Globulin, Total: 2.8 g/dL (ref 1.5–4.5)
Glucose: 101 mg/dL — ABNORMAL HIGH (ref 70–99)
Potassium: 4.2 mmol/L (ref 3.5–5.2)
Sodium: 143 mmol/L (ref 134–144)
Total Protein: 7.4 g/dL (ref 6.0–8.5)
eGFR: 91 mL/min/{1.73_m2} (ref >59)

## 2021-09-30 LAB — CBC
Hematocrit: 34 % (ref 34.0–46.6)
Hemoglobin: 11.5 g/dL (ref 11.1–15.9)
MCH: 26.7 pg (ref 26.6–33.0)
MCHC: 33.8 g/dL (ref 31.5–35.7)
MCV: 79 fL (ref 79–97)
Platelets: 279 10*3/uL (ref 150–450)
RBC: 4.3 x10E6/uL (ref 3.77–5.28)
RDW: 14 % (ref 11.7–15.4)
WBC: 7 10*3/uL (ref 3.4–10.8)

## 2021-12-17 ENCOUNTER — Other Ambulatory Visit: Payer: Self-pay | Admitting: Nurse Practitioner

## 2021-12-17 DIAGNOSIS — I10 Essential (primary) hypertension: Secondary | ICD-10-CM

## 2022-03-30 ENCOUNTER — Ambulatory Visit (INDEPENDENT_AMBULATORY_CARE_PROVIDER_SITE_OTHER): Payer: 59 | Admitting: Nurse Practitioner

## 2022-03-30 ENCOUNTER — Encounter: Payer: Self-pay | Admitting: Nurse Practitioner

## 2022-03-30 VITALS — BP 133/69 | HR 105 | Temp 97.8°F | Ht 59.0 in | Wt 113.4 lb

## 2022-03-30 DIAGNOSIS — Z23 Encounter for immunization: Secondary | ICD-10-CM | POA: Diagnosis not present

## 2022-03-30 DIAGNOSIS — Z13220 Encounter for screening for lipoid disorders: Secondary | ICD-10-CM | POA: Diagnosis not present

## 2022-03-30 DIAGNOSIS — I159 Secondary hypertension, unspecified: Secondary | ICD-10-CM | POA: Diagnosis not present

## 2022-03-30 NOTE — Assessment & Plan Note (Signed)
-   Pneumococcal conjugate vaccine 13-valent IM  2. Hypertension  Please continue current medications  Follow up:  Follow up in 6 months

## 2022-03-30 NOTE — Patient Instructions (Addendum)
1. Need for vaccination against Streptococcus pneumoniae using pneumococcal conjugate vaccine 13  - Pneumococcal conjugate vaccine 13-valent IM  2. Hypertension  Please continue current medications  Follow up:  Follow up in 6 months

## 2022-03-30 NOTE — Progress Notes (Signed)
@Patient  ID: Molly Rodriguez, female    DOB: 1955-08-10, 67 y.o.   MRN: [id number]  Chief Complaint  Patient presents with   Hypertension    6 month fu    Referring provider: No ref. provider found   HPI  Molly Rodriguez presents for follow up. She  has a past medical history of Hypercholesteremia and Hypertension.    Patient presents today for a follow-up on hypertension.  Overall patient states that she has been doing well.  She has no new issues or concerns today.  She does not currently need refills on her medications.  She states she is compliant with medications for cholesterol and low-salt diet.  Patient's daughter is translating for her today. Denies f/c/s, n/v/d, hemoptysis, PND, leg swelling Denies chest pain or edema     No Known Allergies  Immunization History  Administered Date(s) Administered   Influenza Whole 11/06/2010   Influenza,inj,Quad PF,6+ Mos 11/14/2015, 12/24/2016, 12/25/2017   Influenza-Unspecified 12/27/2021   PFIZER(Purple Top)SARS-COV-2 Vaccination 02/28/2019, 03/21/2019   Pneumococcal Conjugate-13 03/30/2022   Pneumococcal Polysaccharide-23 09/30/2020   Tdap 08/23/2016    Past Medical History:  Diagnosis Date   Hypercholesteremia    Hypertension     Tobacco History: Social History   Tobacco Use  Smoking Status Never  Smokeless Tobacco Never   Counseling given: Not Answered   Outpatient Encounter Medications as of 03/30/2022  Medication Sig   amLODipine (NORVASC) 10 MG tablet TAKE 1 TABLET(10 MG) BY MOUTH DAILY   lisinopril (ZESTRIL) 30 MG tablet TAKE 1 TABLET(30 MG) BY MOUTH DAILY   metoprolol succinate (TOPROL-XL) 50 MG 24 hr tablet TAKE 1 TABLET BY MOUTH EVERY DAY, TAKE WITH A MEAL OR FOLLOWING MEAL   No facility-administered encounter medications on file as of 03/30/2022.     Review of Systems  Review of Systems  Constitutional: Negative.   HENT: Negative.    Cardiovascular: Negative.   Gastrointestinal: Negative.    Allergic/Immunologic: Negative.   Neurological: Negative.   Psychiatric/Behavioral: Negative.         Physical Exam  BP 133/69   Pulse (!) 105   Temp 97.8 F (36.6 C) (Temporal)   Ht 4\' 11"  (1.499 m)   Wt 113 lb 6.4 oz (51.4 kg)   SpO2 100%   BMI 22.90 kg/m   Wt Readings from Last 5 Encounters:  03/30/22 113 lb 6.4 oz (51.4 kg)  09/29/21 108 lb 12.8 oz (49.4 kg)  03/31/21 110 lb 3.2 oz (50 kg)  09/30/20 107 lb 0.2 oz (48.5 kg)  10/02/19 112 lb (50.8 kg)     Physical Exam Vitals and nursing note reviewed.  Constitutional:      General: She is not in acute distress.    Appearance: She is well-developed.  Cardiovascular:     Rate and Rhythm: Normal rate and regular rhythm.  Pulmonary:     Effort: Pulmonary effort is normal.     Breath sounds: Normal breath sounds.  Neurological:     Mental Status: She is alert and oriented to person, place, and time.      Lab Results:  CBC    Component Value Date/Time   WBC 7.0 09/29/2021 1339   WBC 6.6 07/09/2016 1615   RBC 4.30 09/29/2021 1339   RBC 4.70 07/09/2016 1615   HGB 11.5 09/29/2021 1339   HCT 34.0 09/29/2021 1339   PLT 279 09/29/2021 1339   MCV 79 09/29/2021 1339   MCH 26.7 09/29/2021 1339   MCH 25.7 (L) 07/09/2016  1615   MCHC 33.8 09/29/2021 1339   MCHC 32.1 07/09/2016 1615   RDW 14.0 09/29/2021 1339   LYMPHSABS 3.0 10/02/2019 1554   MONOABS 330 07/09/2016 1615   EOSABS 0.1 10/02/2019 1554   BASOSABS 0.1 10/02/2019 1554    BMET    Component Value Date/Time   NA 143 09/29/2021 1339   K 4.2 09/29/2021 1339   CL 104 09/29/2021 1339   CO2 23 09/29/2021 1339   GLUCOSE 101 (H) 09/29/2021 1339   GLUCOSE 106 (H) 12/24/2016 1533   BUN 13 09/29/2021 1339   CREATININE 0.73 09/29/2021 1339   CREATININE 0.72 12/24/2016 1533   CALCIUM 9.4 09/29/2021 1339   GFRNONAA 87 10/02/2019 1554   GFRNONAA 90 12/24/2016 1533   GFRAA 101 10/02/2019 1554   GFRAA 105 12/24/2016 1533      Assessment & Plan:    Need for vaccination against Streptococcus pneumoniae using pneumococcal conjugate vaccine 13 - Pneumococcal conjugate vaccine 13-valent IM  2. Hypertension  Please continue current medications  Follow up:  Follow up in 6 months     Fenton Foy, NP 03/30/2022

## 2022-03-31 LAB — CBC
Hematocrit: 36.1 % (ref 34.0–46.6)
Hemoglobin: 12.2 g/dL (ref 11.1–15.9)
MCH: 26.6 pg (ref 26.6–33.0)
MCHC: 33.8 g/dL (ref 31.5–35.7)
MCV: 79 fL (ref 79–97)
Platelets: 297 10*3/uL (ref 150–450)
RBC: 4.58 x10E6/uL (ref 3.77–5.28)
RDW: 14.2 % (ref 11.7–15.4)
WBC: 6.7 10*3/uL (ref 3.4–10.8)

## 2022-03-31 LAB — LIPID PANEL
Chol/HDL Ratio: 2.5 ratio (ref 0.0–4.4)
Cholesterol, Total: 208 mg/dL — ABNORMAL HIGH (ref 100–199)
HDL: 84 mg/dL (ref >39)
LDL Chol Calc (NIH): 99 mg/dL (ref 0–99)
Triglycerides: 145 mg/dL (ref 0–149)
VLDL Cholesterol Cal: 25 mg/dL (ref 5–40)

## 2022-03-31 LAB — COMPREHENSIVE METABOLIC PANEL
ALT: 24 IU/L (ref 0–32)
AST: 25 IU/L (ref 0–40)
Albumin/Globulin Ratio: 2 (ref 1.2–2.2)
Albumin: 4.9 g/dL (ref 3.9–4.9)
Alkaline Phosphatase: 55 IU/L (ref 44–121)
BUN/Creatinine Ratio: 24 (ref 12–28)
BUN: 17 mg/dL (ref 8–27)
Bilirubin Total: 0.4 mg/dL (ref 0.0–1.2)
CO2: 18 mmol/L — ABNORMAL LOW (ref 20–29)
Calcium: 9.5 mg/dL (ref 8.7–10.3)
Chloride: 102 mmol/L (ref 96–106)
Creatinine, Ser: 0.72 mg/dL (ref 0.57–1.00)
Globulin, Total: 2.5 g/dL (ref 1.5–4.5)
Glucose: 109 mg/dL — ABNORMAL HIGH (ref 70–99)
Potassium: 4.2 mmol/L (ref 3.5–5.2)
Sodium: 136 mmol/L (ref 134–144)
Total Protein: 7.4 g/dL (ref 6.0–8.5)
eGFR: 92 mL/min/{1.73_m2} (ref >59)

## 2022-06-16 ENCOUNTER — Other Ambulatory Visit: Payer: Self-pay | Admitting: Nurse Practitioner

## 2022-06-16 DIAGNOSIS — I10 Essential (primary) hypertension: Secondary | ICD-10-CM

## 2022-06-19 ENCOUNTER — Other Ambulatory Visit: Payer: Self-pay

## 2022-06-19 DIAGNOSIS — I10 Essential (primary) hypertension: Secondary | ICD-10-CM

## 2022-06-19 MED ORDER — METOPROLOL SUCCINATE ER 50 MG PO TB24
ORAL_TABLET | ORAL | 1 refills | Status: DC
Start: 2022-06-19 — End: 2022-09-28

## 2022-06-26 ENCOUNTER — Telehealth: Payer: Self-pay | Admitting: Nurse Practitioner

## 2022-06-26 NOTE — Telephone Encounter (Signed)
Called patient to schedule Medicare Annual Wellness Visit (AWV). No voicemail available to leave a message.  Last date of AWV: due 06/26/2021 awvi per [facility]  Please schedule an appointment at any time with Abby, NHA. .  If any questions, please contact me at [phone].  Thank you,  Judeth Cornfield,  AMB Clinical Support [facility] AWV Program Direct Dial ??[phone]

## 2022-09-28 ENCOUNTER — Ambulatory Visit (INDEPENDENT_AMBULATORY_CARE_PROVIDER_SITE_OTHER): Payer: 59 | Admitting: Nurse Practitioner

## 2022-09-28 ENCOUNTER — Encounter: Payer: Self-pay | Admitting: Nurse Practitioner

## 2022-09-28 VITALS — BP 119/78 | HR 84 | Temp 98.0°F | Ht 59.0 in | Wt 115.0 lb

## 2022-09-28 DIAGNOSIS — Z1231 Encounter for screening mammogram for malignant neoplasm of breast: Secondary | ICD-10-CM | POA: Diagnosis not present

## 2022-09-28 DIAGNOSIS — I10 Essential (primary) hypertension: Secondary | ICD-10-CM

## 2022-09-28 DIAGNOSIS — R600 Localized edema: Secondary | ICD-10-CM | POA: Diagnosis not present

## 2022-09-28 MED ORDER — AMLODIPINE BESYLATE 10 MG PO TABS: ORAL_TABLET | ORAL | 1 refills | Status: DC

## 2022-09-28 MED ORDER — LISINOPRIL 30 MG PO TABS
ORAL_TABLET | ORAL | 1 refills | Status: DC
Start: 2022-09-28 — End: 2023-05-27

## 2022-09-28 MED ORDER — METOPROLOL SUCCINATE ER 50 MG PO TB24: ORAL_TABLET | ORAL | 1 refills | Status: DC

## 2022-09-28 NOTE — Progress Notes (Signed)
@Patient  ID: Molly Rodriguez, female    DOB: January 07, 1956, 67 y.o.   MRN: [id number]  Chief Complaint  Patient presents with   Medical Management of Chronic Issues    Swelling in legs     Referring provider: Ivonne Andrew, NP   HPI  Molly Rodriguez presents for follow up. She  has a past medical history of Hypercholesteremia and Hypertension.    Patient presents today for a follow-up on hypertension.  Overall patient states that she has been doing well.  She has no new issues or concerns today.  She does not currently need refills on her medications.  She states she is compliant with medications for cholesterol and low-salt diet.  Patient's daughter is translating for her today. Patient has noticed slight leg swelling in the afternoons. We discussed low salt diet, compression hose and elevating legs.  Denies f/c/s, n/v/d, hemoptysis, PND, leg swelling Denies chest pain or edema      No Known Allergies  Immunization History  Administered Date(s) Administered   Influenza Whole 11/06/2010   Influenza,inj,Quad PF,6+ Mos 11/14/2015, 12/24/2016, 12/25/2017   Influenza-Unspecified 12/27/2021   PFIZER(Purple Top)SARS-COV-2 Vaccination 02/28/2019, 03/21/2019   Pneumococcal Conjugate-13 03/30/2022   Pneumococcal Polysaccharide-23 09/30/2020   Tdap 08/23/2016    Past Medical History:  Diagnosis Date   Hypercholesteremia    Hypertension     Tobacco History: Social History   Tobacco Use  Smoking Status Never  Smokeless Tobacco Never   Counseling given: Not Answered   Outpatient Encounter Medications as of 09/28/2022  Medication Sig   [DISCONTINUED] amLODipine (NORVASC) 10 MG tablet TAKE 1 TABLET(10 MG) BY MOUTH DAILY   [DISCONTINUED] lisinopril (ZESTRIL) 30 MG tablet TAKE 1 TABLET(30 MG) BY MOUTH DAILY   [DISCONTINUED] metoprolol succinate (TOPROL-XL) 50 MG 24 hr tablet TAKE 1 TABLET BY MOUTH EVERY DAY, TAKE WITH A MEAL OR FOLLOWING MEAL   amLODipine (NORVASC) 10 MG tablet TAKE 1  TABLET(10 MG) BY MOUTH DAILY   lisinopril (ZESTRIL) 30 MG tablet TAKE 1 TABLET(30 MG) BY MOUTH DAILY   metoprolol succinate (TOPROL-XL) 50 MG 24 hr tablet TAKE 1 TABLET BY MOUTH EVERY DAY, TAKE WITH A MEAL OR FOLLOWING MEAL   No facility-administered encounter medications on file as of 09/28/2022.     Review of Systems  Review of Systems  Constitutional: Negative.   HENT: Negative.    Cardiovascular: Negative.   Gastrointestinal: Negative.   Allergic/Immunologic: Negative.   Neurological: Negative.   Psychiatric/Behavioral: Negative.         Physical Exam  BP 119/78   Pulse 84   Temp 98 F (36.7 C) (Oral)   Ht 4\' 11"  (1.499 m)   Wt 115 lb (52.2 kg)   SpO2 100%   BMI 23.23 kg/m   Wt Readings from Last 5 Encounters:  09/28/22 115 lb (52.2 kg)  03/30/22 113 lb 6.4 oz (51.4 kg)  09/29/21 108 lb 12.8 oz (49.4 kg)  03/31/21 110 lb 3.2 oz (50 kg)  09/30/20 107 lb 0.2 oz (48.5 kg)     Physical Exam Vitals and nursing note reviewed.  Constitutional:      General: She is not in acute distress.    Appearance: She is well-developed.  Cardiovascular:     Rate and Rhythm: Normal rate and regular rhythm.  Pulmonary:     Effort: Pulmonary effort is normal.     Breath sounds: Normal breath sounds.  Neurological:     Mental Status: She is alert and oriented to person,  place, and time.      Lab Results:  CBC    Component Value Date/Time   WBC 6.7 03/30/2022 1401   WBC 6.6 07/09/2016 1615   RBC 4.58 03/30/2022 1401   RBC 4.70 07/09/2016 1615   HGB 12.2 03/30/2022 1401   HCT 36.1 03/30/2022 1401   PLT 297 03/30/2022 1401   MCV 79 03/30/2022 1401   MCH 26.6 03/30/2022 1401   MCH 25.7 (L) 07/09/2016 1615   MCHC 33.8 03/30/2022 1401   MCHC 32.1 07/09/2016 1615   RDW 14.2 03/30/2022 1401   LYMPHSABS 3.0 10/02/2019 1554   MONOABS 330 07/09/2016 1615   EOSABS 0.1 10/02/2019 1554   BASOSABS 0.1 10/02/2019 1554    BMET    Component Value Date/Time   NA 136  03/30/2022 1401   K 4.2 03/30/2022 1401   CL 102 03/30/2022 1401   CO2 18 (L) 03/30/2022 1401   GLUCOSE 109 (H) 03/30/2022 1401   GLUCOSE 106 (H) 12/24/2016 1533   BUN 17 03/30/2022 1401   CREATININE 0.72 03/30/2022 1401   CREATININE 0.72 12/24/2016 1533   CALCIUM 9.5 03/30/2022 1401   GFRNONAA 87 10/02/2019 1554   GFRNONAA 90 12/24/2016 1533   GFRAA 101 10/02/2019 1554   GFRAA 105 12/24/2016 1533    BNP No results found for: "BNP"  ProBNP    Component Value Date/Time   PROBNP 365.2 (H) 09/09/2010 1052    Imaging: No results found.   Assessment & Plan:   Encounter for screening mammogram for malignant neoplasm of breast - MM Digital Screening; Future  2. Essential hypertension  - CBC - Comprehensive metabolic panel - metoprolol succinate (TOPROL-XL) 50 MG 24 hr tablet; TAKE 1 TABLET BY MOUTH EVERY DAY, TAKE WITH A MEAL OR FOLLOWING MEAL  Dispense: 90 tablet; Refill: 1 - lisinopril (ZESTRIL) 30 MG tablet; TAKE 1 TABLET(30 MG) BY MOUTH DAILY  Dispense: 90 tablet; Refill: 1 - amLODipine (NORVASC) 10 MG tablet; TAKE 1 TABLET(10 MG) BY MOUTH DAILY  Dispense: 90 tablet; Refill: 1  3. Peripheral edema  - Brain natriuretic peptide  Follow up:  Follow up in 6 months     Ivonne Andrew, NP 09/28/2022

## 2022-09-28 NOTE — Patient Instructions (Signed)
1. Encounter for screening mammogram for malignant neoplasm of breast  - MM Digital Screening; Future  2. Essential hypertension  - CBC - Comprehensive metabolic panel - metoprolol succinate (TOPROL-XL) 50 MG 24 hr tablet; TAKE 1 TABLET BY MOUTH EVERY DAY, TAKE WITH A MEAL OR FOLLOWING MEAL  Dispense: 90 tablet; Refill: 1 - lisinopril (ZESTRIL) 30 MG tablet; TAKE 1 TABLET(30 MG) BY MOUTH DAILY  Dispense: 90 tablet; Refill: 1 - amLODipine (NORVASC) 10 MG tablet; TAKE 1 TABLET(10 MG) BY MOUTH DAILY  Dispense: 90 tablet; Refill: 1  3. Peripheral edema  - Brain natriuretic peptide  Follow up:  Follow up in 6 months  Edema  Edema is when you have too much fluid in your body or under your skin. Edema may make your legs, feet, and ankles swell. Swelling often happens in looser tissues, such as around your eyes. This is a common condition. It gets more common as you get older. There are many possible causes of edema. These include: Eating too much salt (sodium). Being on your feet or sitting for a long time. Certain medical conditions, such as: Pregnancy. Heart failure. Liver disease. Kidney disease. Cancer. Hot weather may make edema worse. Edema is usually painless. Your skin may look swollen or shiny. Follow these instructions at home: Medicines Take over-the-counter and prescription medicines only as told by your doctor. Your doctor may prescribe a medicine to help your body get rid of extra water (diuretic). Take this medicine if you are told to take it. Eating and drinking Eat a low-salt (low-sodium) diet as told by your doctor. Sometimes, eating less salt may reduce swelling. Depending on the cause of your swelling, you may need to limit how much fluid you drink (fluid restriction). General instructions Raise the injured area above the level of your heart while you are sitting or lying down. Do not sit still or stand for a long time. Do not wear tight clothes. Do not wear  garters on your upper legs. Exercise your legs. This can help the swelling go down. Wear compression stockings as told by your doctor. It is important that these are the right size. These should be prescribed by your doctor to prevent possible injuries. If elastic bandages or wraps are recommended, use them as told by your doctor. Contact a doctor if: Treatment is not working. You have heart, liver, or kidney disease and have symptoms of edema. You have sudden and unexplained weight gain. Get help right away if: You have shortness of breath or chest pain. You cannot breathe when you lie down. You have pain, redness, or warmth in the swollen areas. You have heart, liver, or kidney disease and get edema all of a sudden. You have a fever and your symptoms get worse all of a sudden. These symptoms may be an emergency. Get help right away. Call 911. Do not wait to see if the symptoms will go away. Do not drive yourself to the hospital. Summary Edema is when you have too much fluid in your body or under your skin. Edema may make your legs, feet, and ankles swell. Swelling often happens in looser tissues, such as around your eyes. Raise the injured area above the level of your heart while you are sitting or lying down. Follow your doctor's instructions about diet and how much fluid you can drink. This information is not intended to replace advice given to you by your health care provider. Make sure you discuss any questions you have with your  health care provider. Document Revised: 10/17/2020 Document Reviewed: 10/17/2020 Elsevier Patient Education  2024 [employer].

## 2022-09-28 NOTE — Assessment & Plan Note (Signed)
-   MM Digital Screening; Future  2. Essential hypertension  - CBC - Comprehensive metabolic panel - metoprolol succinate (TOPROL-XL) 50 MG 24 hr tablet; TAKE 1 TABLET BY MOUTH EVERY DAY, TAKE WITH A MEAL OR FOLLOWING MEAL  Dispense: 90 tablet; Refill: 1 - lisinopril (ZESTRIL) 30 MG tablet; TAKE 1 TABLET(30 MG) BY MOUTH DAILY  Dispense: 90 tablet; Refill: 1 - amLODipine (NORVASC) 10 MG tablet; TAKE 1 TABLET(10 MG) BY MOUTH DAILY  Dispense: 90 tablet; Refill: 1  3. Peripheral edema  - Brain natriuretic peptide  Follow up:  Follow up in 6 months

## 2022-10-12 ENCOUNTER — Ambulatory Visit
Admission: RE | Admit: 2022-10-12 | Discharge: 2022-10-12 | Disposition: A | Discharge status: Home / Self Care | Payer: 59 | Source: Ambulatory Visit | Attending: Nurse Practitioner | Admitting: Nurse Practitioner

## 2022-10-12 DIAGNOSIS — Z1231 Encounter for screening mammogram for malignant neoplasm of breast: Secondary | ICD-10-CM

## 2023-04-05 ENCOUNTER — Ambulatory Visit: Payer: Self-pay | Admitting: Nurse Practitioner

## 2023-04-26 ENCOUNTER — Ambulatory Visit: Payer: Self-pay | Admitting: Nurse Practitioner

## 2023-05-27 ENCOUNTER — Ambulatory Visit (INDEPENDENT_AMBULATORY_CARE_PROVIDER_SITE_OTHER): Payer: Self-pay | Admitting: Nurse Practitioner

## 2023-05-27 ENCOUNTER — Encounter: Payer: Self-pay | Admitting: Nurse Practitioner

## 2023-05-27 VITALS — BP 119/68 | HR 74 | Temp 97.9°F | Wt 118.0 lb

## 2023-05-27 DIAGNOSIS — I10 Essential (primary) hypertension: Secondary | ICD-10-CM

## 2023-05-27 DIAGNOSIS — Z13220 Encounter for screening for lipoid disorders: Secondary | ICD-10-CM

## 2023-05-27 MED ORDER — AMLODIPINE BESYLATE 10 MG PO TABS: ORAL_TABLET | ORAL | 1 refills | Status: DC

## 2023-05-27 MED ORDER — LISINOPRIL 30 MG PO TABS
ORAL_TABLET | ORAL | 1 refills | Status: DC
Start: 2023-05-27 — End: 2023-12-10

## 2023-05-27 MED ORDER — METOPROLOL SUCCINATE ER 50 MG PO TB24: ORAL_TABLET | ORAL | 1 refills | Status: DC

## 2023-05-27 NOTE — Progress Notes (Signed)
 Subjective   Patient ID: Molly Rodriguez, female    DOB: [birth year], 68 y.o.   MRN: [id number]  Chief Complaint  Patient presents with   Medical Management of Chronic Issues    Referring provider: Ivonne Andrew, NP  Molly Rodriguez is a 68 y.o. female with Past Medical History: No date: Hypercholesteremia No date: Hypertension   HPI  Patient presents today for a follow-up on hypertension.  Overall patient states that she has been doing well.  She has no new issues or concerns today.  She does not currently need refills on her medications.  She states she is compliant with medications for cholesterol and low-salt diet.  Patient's niece is translating for her today.  Denies f/c/s, n/v/d, hemoptysis, PND, leg swelling Denies chest pain or edema.     No Known Allergies  Immunization History  Administered Date(s) Administered   Influenza Whole 11/06/2010   Influenza,inj,Quad PF,6+ Mos 11/14/2015, 12/24/2016, 12/25/2017   Influenza-Unspecified 12/27/2021   PFIZER(Purple Top)SARS-COV-2 Vaccination 02/28/2019, 03/21/2019   Pneumococcal Conjugate-13 03/30/2022   Pneumococcal Polysaccharide-23 09/30/2020   Tdap 08/23/2016    Tobacco History: Social History   Tobacco Use  Smoking Status Never  Smokeless Tobacco Never   Counseling given: Not Answered   Outpatient Encounter Medications as of 05/27/2023  Medication Sig   [DISCONTINUED] amLODipine (NORVASC) 10 MG tablet TAKE 1 TABLET(10 MG) BY MOUTH DAILY   [DISCONTINUED] lisinopril (ZESTRIL) 30 MG tablet TAKE 1 TABLET(30 MG) BY MOUTH DAILY   [DISCONTINUED] metoprolol succinate (TOPROL-XL) 50 MG 24 hr tablet TAKE 1 TABLET BY MOUTH EVERY DAY, TAKE WITH A MEAL OR FOLLOWING MEAL   amLODipine (NORVASC) 10 MG tablet TAKE 1 TABLET(10 MG) BY MOUTH DAILY   lisinopril (ZESTRIL) 30 MG tablet TAKE 1 TABLET(30 MG) BY MOUTH DAILY   metoprolol succinate (TOPROL-XL) 50 MG 24 hr tablet TAKE 1 TABLET BY MOUTH EVERY DAY, TAKE WITH A MEAL OR FOLLOWING  MEAL   No facility-administered encounter medications on file as of 05/27/2023.    Review of Systems  Review of Systems  Constitutional: Negative.   HENT: Negative.    Cardiovascular: Negative.   Gastrointestinal: Negative.   Allergic/Immunologic: Negative.   Neurological: Negative.   Psychiatric/Behavioral: Negative.       Objective:   BP 119/68   Pulse 74   Temp 97.9 F (36.6 C) (Oral)   Wt 118 lb (53.5 kg)   SpO2 100%   BMI 23.83 kg/m   Wt Readings from Last 5 Encounters:  05/27/23 118 lb (53.5 kg)  09/28/22 115 lb (52.2 kg)  03/30/22 113 lb 6.4 oz (51.4 kg)  09/29/21 108 lb 12.8 oz (49.4 kg)  03/31/21 110 lb 3.2 oz (50 kg)     Physical Exam Vitals and nursing note reviewed.  Constitutional:      General: She is not in acute distress.    Appearance: She is well-developed.  Cardiovascular:     Rate and Rhythm: Normal rate and regular rhythm.  Pulmonary:     Effort: Pulmonary effort is normal.     Breath sounds: Normal breath sounds.  Neurological:     Mental Status: She is alert and oriented to person, place, and time.       Assessment & Plan:   Essential hypertension -     Lipid panel -     CBC -     Comprehensive metabolic panel with GFR -     amLODIPine Besylate; TAKE 1 TABLET(10 MG) BY MOUTH DAILY  Dispense:  90 tablet; Refill: 1 -     Lisinopril; TAKE 1 TABLET(30 MG) BY MOUTH DAILY  Dispense: 90 tablet; Refill: 1 -     Metoprolol Succinate ER; TAKE 1 TABLET BY MOUTH EVERY DAY, TAKE WITH A MEAL OR FOLLOWING MEAL  Dispense: 90 tablet; Refill: 1  Lipid screening -     Lipid panel     Return in about 6 months (around 11/26/2023).   Ivonne Andrew, NP 05/27/2023

## 2023-05-28 LAB — COMPREHENSIVE METABOLIC PANEL WITH GFR
ALT: 22 IU/L (ref 0–32)
AST: 23 IU/L (ref 0–40)
Albumin: 5 g/dL — ABNORMAL HIGH (ref 3.9–4.9)
Alkaline Phosphatase: 64 IU/L (ref 44–121)
BUN/Creatinine Ratio: 24 (ref 12–28)
BUN: 21 mg/dL (ref 8–27)
Bilirubin Total: 0.3 mg/dL (ref 0.0–1.2)
CO2: 20 mmol/L (ref 20–29)
Calcium: 9.8 mg/dL (ref 8.7–10.3)
Chloride: 103 mmol/L (ref 96–106)
Creatinine, Ser: 0.88 mg/dL (ref 0.57–1.00)
Globulin, Total: 2.8 g/dL (ref 1.5–4.5)
Glucose: 101 mg/dL — ABNORMAL HIGH (ref 70–99)
Potassium: 4.4 mmol/L (ref 3.5–5.2)
Sodium: 138 mmol/L (ref 134–144)
Total Protein: 7.8 g/dL (ref 6.0–8.5)
eGFR: 72 mL/min/{1.73_m2} (ref >59)

## 2023-05-28 LAB — LIPID PANEL
Chol/HDL Ratio: 6 ratio — ABNORMAL HIGH (ref 0.0–4.4)
Cholesterol, Total: 258 mg/dL — ABNORMAL HIGH (ref 100–199)
HDL: 43 mg/dL (ref >39)
LDL Chol Calc (NIH): 166 mg/dL — ABNORMAL HIGH (ref 0–99)
Triglycerides: 261 mg/dL — ABNORMAL HIGH (ref 0–149)
VLDL Cholesterol Cal: 49 mg/dL — ABNORMAL HIGH (ref 5–40)

## 2023-05-28 LAB — CBC
Hematocrit: 38.4 % (ref 34.0–46.6)
Hemoglobin: 12.3 g/dL (ref 11.1–15.9)
MCH: 25.9 pg — ABNORMAL LOW (ref 26.6–33.0)
MCHC: 32 g/dL (ref 31.5–35.7)
MCV: 81 fL (ref 79–97)
Platelets: 328 10*3/uL (ref 150–450)
RBC: 4.74 x10E6/uL (ref 3.77–5.28)
RDW: 13.1 % (ref 11.7–15.4)
WBC: 8 10*3/uL (ref 3.4–10.8)

## 2023-05-29 ENCOUNTER — Other Ambulatory Visit: Payer: Self-pay | Admitting: Nurse Practitioner

## 2023-05-29 MED ORDER — ROSUVASTATIN CALCIUM 10 MG PO TABS: 10.0000 mg | ORAL_TABLET | Freq: Every day | ORAL | 11 refills | Status: AC

## 2023-09-11 ENCOUNTER — Other Ambulatory Visit: Payer: Self-pay | Admitting: Nurse Practitioner

## 2023-09-11 DIAGNOSIS — Z1231 Encounter for screening mammogram for malignant neoplasm of breast: Secondary | ICD-10-CM

## 2023-10-18 ENCOUNTER — Ambulatory Visit
Admission: RE | Admit: 2023-10-18 | Discharge: 2023-10-18 | Disposition: A | Discharge status: Home / Self Care | Source: Ambulatory Visit | Attending: Nurse Practitioner | Admitting: Nurse Practitioner

## 2023-10-18 DIAGNOSIS — Z1231 Encounter for screening mammogram for malignant neoplasm of breast: Secondary | ICD-10-CM

## 2023-11-29 ENCOUNTER — Ambulatory Visit (INDEPENDENT_AMBULATORY_CARE_PROVIDER_SITE_OTHER): Payer: Self-pay | Admitting: Nurse Practitioner

## 2023-11-29 ENCOUNTER — Encounter: Payer: Self-pay | Admitting: Nurse Practitioner

## 2023-11-29 VITALS — BP 133/71 | HR 88 | Wt 116.2 lb

## 2023-11-29 DIAGNOSIS — M25551 Pain in right hip: Secondary | ICD-10-CM

## 2023-11-29 DIAGNOSIS — M25552 Pain in left hip: Secondary | ICD-10-CM | POA: Diagnosis not present

## 2023-11-29 MED ORDER — PREDNISONE 20 MG PO TABS
20.0000 mg | ORAL_TABLET | Freq: Every day | ORAL | 0 refills | Status: AC
Start: 2023-11-29 — End: ?

## 2023-11-29 NOTE — Progress Notes (Signed)
 Subjective   Patient ID: Molly Rodriguez, female    DOB: 02/22/1956, 68 y.o.   MRN: [id number]  Chief Complaint  Patient presents with   Medical Management of Chronic Issues    Hip pain - down to legs     Referring provider: Oley Bascom RAMAN, NP  Molly Rodriguez is a 68 y.o. female with Past Medical History: No date: Hypercholesteremia No date: Hypertension   HPI  Patient presents today for a follow-up on hypertension.  Overall patient states that she has been doing well.  She has no new issues or concerns today.  She does not currently need refills on her medications.  She states she is compliant with medications for cholesterol and low-salt diet.  Patient's niece is translating for her today.  Patient complains of bilateral hip pain today. States that she walks everyday and has noticed increased pain. Will trial prednisone and place referral to ortho. Denies any falls or injury.    Denies f/c/s, n/v/d, hemoptysis, PND, leg swelling Denies chest pain or edema.     No Known Allergies  Immunization History  Administered Date(s) Administered   Influenza Whole 11/06/2010   Influenza,inj,Quad PF,6+ Mos 11/14/2015, 12/24/2016, 12/25/2017   Influenza-Unspecified 12/27/2021   PFIZER(Purple Top)SARS-COV-2 Vaccination 02/28/2019, 03/21/2019   Pneumococcal Conjugate-13 03/30/2022   Pneumococcal Polysaccharide-23 09/30/2020   Tdap 08/23/2016    Tobacco History: Social History   Tobacco Use  Smoking Status Never  Smokeless Tobacco Never   Counseling given: Not Answered   Outpatient Encounter Medications as of 11/29/2023  Medication Sig   amLODipine  (NORVASC ) 10 MG tablet TAKE 1 TABLET(10 MG) BY MOUTH DAILY   lisinopril  (ZESTRIL ) 30 MG tablet TAKE 1 TABLET(30 MG) BY MOUTH DAILY   metoprolol  succinate (TOPROL -XL) 50 MG 24 hr tablet TAKE 1 TABLET BY MOUTH EVERY DAY, TAKE WITH A MEAL OR FOLLOWING MEAL   predniSONE (DELTASONE) 20 MG tablet Take 1 tablet (20 mg total) by mouth daily with  breakfast.   rosuvastatin  (CRESTOR ) 10 MG tablet Take 1 tablet (10 mg total) by mouth daily.   No facility-administered encounter medications on file as of 11/29/2023.    Review of Systems  Review of Systems  Constitutional: Negative.   HENT: Negative.    Cardiovascular: Negative.   Gastrointestinal: Negative.   Allergic/Immunologic: Negative.   Neurological: Negative.   Psychiatric/Behavioral: Negative.       Objective:   Wt 116 lb 3.2 oz (52.7 kg)   BMI 23.47 kg/m   Wt Readings from Last 5 Encounters:  11/29/23 116 lb 3.2 oz (52.7 kg)  05/27/23 118 lb (53.5 kg)  09/28/22 115 lb (52.2 kg)  03/30/22 113 lb 6.4 oz (51.4 kg)  09/29/21 108 lb 12.8 oz (49.4 kg)     Physical Exam Vitals and nursing note reviewed.  Constitutional:      General: She is not in acute distress.    Appearance: She is well-developed.  Cardiovascular:     Rate and Rhythm: Normal rate and regular rhythm.  Pulmonary:     Effort: Pulmonary effort is normal.     Breath sounds: Normal breath sounds.  Neurological:     Mental Status: She is alert and oriented to person, place, and time.       Assessment & Plan:   Bilateral hip pain -     Ambulatory referral to Orthopedics -     predniSONE; Take 1 tablet (20 mg total) by mouth daily with breakfast.  Dispense: 5 tablet; Refill: 0  Return in about 6 months (around 05/29/2024).   Bascom GORMAN Borer, NP 11/29/2023

## 2023-12-08 ENCOUNTER — Other Ambulatory Visit: Payer: Self-pay | Admitting: Nurse Practitioner

## 2023-12-08 DIAGNOSIS — I10 Essential (primary) hypertension: Secondary | ICD-10-CM

## 2023-12-10 ENCOUNTER — Other Ambulatory Visit: Payer: Self-pay | Admitting: Nurse Practitioner

## 2023-12-10 DIAGNOSIS — I10 Essential (primary) hypertension: Secondary | ICD-10-CM

## 2023-12-13 ENCOUNTER — Other Ambulatory Visit: Payer: Self-pay | Admitting: Nurse Practitioner

## 2023-12-13 DIAGNOSIS — I10 Essential (primary) hypertension: Secondary | ICD-10-CM

## 2024-02-11 ENCOUNTER — Ambulatory Visit: Admitting: Orthopaedic Surgery

## 2024-02-11 ENCOUNTER — Other Ambulatory Visit

## 2024-02-11 DIAGNOSIS — G8929 Other chronic pain: Secondary | ICD-10-CM

## 2024-02-11 MED ORDER — MELOXICAM 15 MG PO TABS: 15.0000 mg | ORAL_TABLET | Freq: Every day | ORAL | 2 refills | Status: AC

## 2024-02-11 NOTE — Progress Notes (Signed)
 Office Visit Note   Patient: Molly Rodriguez           Date of Birth: 1955-11-28           MRN: [id number] Visit Date: 02/11/2024              Requested by: Oley Bascom RAMAN, NP [phone] [place],  [place] [address] PCP: Oley Bascom RAMAN, NP   Assessment & Plan: Visit Diagnoses:  1. Chronic left-sided low back pain, unspecified whether sciatica present     Plan: History of Present Illness Molly Rodriguez is a 68 year old female with bilateral hip osteoarthritis and lumbar spondylosis who presents with bilateral hip and lower back pain.  She has bilateral hip and predominant lower back pain that is intermittently worsened by ambulation and occasionally radiates down the leg. She describes the pain as originating from the back rather than the anterior hip and denies groin or anterior hip pain.  She denies trauma or injury. She has not tried physical therapy or prescription analgesics. Prior imaging showed mild bilateral hip osteoarthritis and degenerative changes in the lower lumbar spine.  Hip exams are normal.  Pain localized to the lumbar spine.    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 Symptoms consistent with spinal etiology. Osteoarthritis mild. - Ordered physical therapy referral for core strengthening and stabilization exercises. - Recommended one to two physical therapy sessions to learn exercises, ongoing therapy optional. - Prescribed meloxicam  as needed for pain exacerbations. - Advised use of over-the-counter analgesics as needed.  Follow-Up Instructions: Return if symptoms worsen or fail to improve.    Orders:  Orders Placed This Encounter  Procedures   XR Lumbar Spine 2-3 Views   XR Pelvis 1-2 Views   Ambulatory referral to Physical Therapy   Meds ordered this encounter  Medications   meloxicam  (MOBIC ) 15 MG tablet    Sig: Take 1 tablet (15 mg total) by mouth daily.    Dispense:  14 tablet    Refill:  2      Procedures: No procedures performed   Clinical Data: No additional findings.   Subjective: Chief Complaint  Patient presents with   Left Hip - Pain   Right Hip - Pain    HPI  Review of Systems  Constitutional: Negative.   HENT: Negative.    Eyes: Negative.   Respiratory: Negative.    Cardiovascular: Negative.   Endocrine: Negative.   Musculoskeletal: Negative.   Neurological: Negative.   Hematological: Negative.   Psychiatric/Behavioral: Negative.    All other systems reviewed and are negative.    Objective: Vital Signs: There were no vitals taken for this visit.  Physical Exam Vitals and nursing note reviewed.  Constitutional:      Appearance: She is well-developed.  HENT:     Head: Atraumatic.     Nose: Nose normal.  Eyes:     Extraocular Movements: Extraocular movements intact.  Cardiovascular:     Pulses: Normal pulses.  Pulmonary:     Effort: Pulmonary effort is normal.  Abdominal:     Palpations: Abdomen is soft.  Musculoskeletal:     Cervical back: Neck supple.  Skin:    General: Skin is warm.  Capillary Refill: Capillary refill takes less than 2 seconds.  Neurological:     Mental Status: She is alert. Mental status is at baseline.  Psychiatric:        Behavior: Behavior normal.        Thought Content: Thought content normal.        Judgment: Judgment normal.     Ortho Exam  Specialty Comments:  No specialty comments available.  Imaging: XR Pelvis 1-2 Views Result Date: 02/11/2024 No acute or structural abnormalities.  Minor arthritic changes.  XR Lumbar Spine 2-3 Views Result Date: 02/11/2024 X-rays of  the lumbar spine show mild arthritic changes.    PMFS History: Patient Active Problem List   Diagnosis Date Noted   Encounter for screening mammogram for malignant neoplasm of breast 09/28/2022   Need for vaccination against Streptococcus pneumoniae using pneumococcal conjugate vaccine 13 03/30/2022   Language barrier 04/04/2019   Seasonal allergies 10/01/2018   Skin rash 10/01/2018   Hyponatremia 12/15/2015   Hypokalemia 12/15/2015   History of alcohol dependence (HCC) 12/15/2015   Dysuria 11/12/2010   Hypertension 11/06/2010   Past Medical History:  Diagnosis Date   Hypercholesteremia    Hypertension     Family History  Problem Relation Age of Onset   BRCA 1/2 Neg Hx    Breast cancer Neg Hx     No past surgical history on file. Social History   Occupational History   Not on file  Tobacco Use   Smoking status: Never   Smokeless tobacco: Never  Vaping Use   Vaping status: Never Used  Substance and Sexual Activity   Alcohol use: Yes    Alcohol/week: 2.0 standard drinks of alcohol    Types: 2 Cans of beer per week    Comment: daily   Drug use: No   Sexual activity: Not Currently    Birth control/protection: Post-menopausal

## 2024-03-06 ENCOUNTER — Other Ambulatory Visit: Payer: Self-pay

## 2024-03-06 ENCOUNTER — Encounter: Payer: Self-pay | Admitting: Physical Therapy

## 2024-03-06 ENCOUNTER — Ambulatory Visit: Attending: Orthopaedic Surgery | Admitting: Physical Therapy

## 2024-03-06 DIAGNOSIS — M79605 Pain in left leg: Secondary | ICD-10-CM | POA: Diagnosis present

## 2024-03-06 DIAGNOSIS — M5459 Other low back pain: Secondary | ICD-10-CM | POA: Diagnosis present

## 2024-03-06 DIAGNOSIS — G8929 Other chronic pain: Secondary | ICD-10-CM | POA: Insufficient documentation

## 2024-03-06 DIAGNOSIS — M545 Low back pain, unspecified: Secondary | ICD-10-CM | POA: Diagnosis not present

## 2024-03-06 DIAGNOSIS — M25552 Pain in left hip: Secondary | ICD-10-CM | POA: Insufficient documentation

## 2024-03-06 NOTE — Therapy (Signed)
 " OUTPATIENT PHYSICAL THERAPY THORACOLUMBAR EVALUATION   Patient Name: Molly Rodriguez MRN: [id number] DOB:[birth year], 69 y.o., female Today's Date: 03/06/2024  END OF SESSION:  PT End of Session - 03/06/24 1046     Visit Number 1    Number of Visits 9    Date for Recertification  05/01/24    Progress Note Due on Visit 9    PT Start Time 1046    PT Stop Time 1125    PT Time Calculation (min) 39 min    Activity Tolerance Patient tolerated treatment well    Behavior During Therapy WFL for tasks assessed/performed          Past Medical History:  Diagnosis Date   Hypercholesteremia    Hypertension    History reviewed. No pertinent surgical history. Patient Active Problem List   Diagnosis Date Noted   Encounter for screening mammogram for malignant neoplasm of breast 09/28/2022   Need for vaccination against Streptococcus pneumoniae using pneumococcal conjugate vaccine 13 03/30/2022   Language barrier 04/04/2019   Seasonal allergies 10/01/2018   Skin rash 10/01/2018   Hyponatremia 12/15/2015   Hypokalemia 12/15/2015   History of alcohol dependence (HCC) 12/15/2015   Dysuria 11/12/2010   Hypertension 11/06/2010    PCP: Bascom Borer, NP  REFERRING PROVIDER: Kay Cummins, MD  REFERRING DIAG:  M54.50,G89.29 (ICD-10-CM) - Chronic left-sided low back pain, unspecified whether sciatica present    Lumbar facet arthritis   Rationale for Evaluation and Treatment: Rehabilitation  THERAPY DIAG:  Other low back pain  Pain in left hip  Pain in left leg  ONSET DATE: September 2025  SUBJECTIVE:                                                                                                                                                                                           SUBJECTIVE STATEMENT: Pt niece present to assist in translation today. States that in September symptoms in her low back came about without MOI. She notes that symptoms have been overall unchanging. Has  noted symptom inc with walking, prolonged sitting. Used to walk x60 mins and is now limited to 20-30 mins. Pt symptoms improve with rest. Reports that medications are not helping with symptoms, symptoms are dependent on activity or position vs time of day. Symptoms range in intensity from 0/10-10/10. Pt reports that she has L hip pain and will shoot down the left leg to the ankle.   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 Symptoms consistent with spinal etiology. Osteoarthritis mild. - Ordered physical therapy referral for core strengthening and stabilization exercises. - Recommended one to two physical therapy sessions to learn exercises, ongoing therapy optional. - Prescribed meloxicam  as needed for pain exacerbations. - Advised use of over-the-counter analgesics as needed.   Follow-Up Instructions: Return if symptoms worsen or fail to improve.   PAIN:  Are you having pain? Yes: NPRS scale: 5/10 Pain location: posterior left hip Pain description: shooting, ache Aggravating factors: walking, prolonged sitting Relieving factors: rest  PRECAUTIONS: None  RED FLAGS: None   WEIGHT BEARING RESTRICTIONS: No  FALLS:  Has patient fallen in last 6 months? No  LIVING ENVIRONMENT: Lives with: lives with their daughter Lives in: House/apartment Stairs: Yes: External: 2 steps; on right going up Has following equipment at home: None  OCCUPATION: morning routine, walking dog, watching TV, exercise  PLOF: Independent  PATIENT GOALS: preserve and progress core strength, return  to walking without pain  NEXT MD VISIT: PRN  OBJECTIVE:  Note: Objective measures were completed at Evaluation unless otherwise noted.  DIAGNOSTIC FINDINGS:  See imaging section of chart for details  PATIENT SURVEYS:  Modified Oswestry:  MODIFIED OSWESTRY DISABILITY SCALE  Date: 03/06/24 Score  Pain intensity 4 =  Pain medication provides me with little relief from pain.  2. Personal care (washing, dressing, etc.) 0 =  I can take care of myself normally without causing increased pain.  3. Lifting 1 = I can lift heavy weights, but it causes increased pain.  4. Walking 3 =  Pain prevents me from walking more than  mile.  5. Sitting 2 =  Pain prevents me from sitting more than 1 hour.  6. Standing 3 =  Pain prevents me from standing more than 1/2 hour.  7. Sleeping 1 = I can sleep well only by using pain medication.  8. Social Life 2 = Pain prevents me from participating in more energetic activities (eg. sports, dancing).  9. Traveling 4 = My pain restricts my travel to short necessary journeys under 1/2 hour.  10. Employment/ Homemaking 2 = I can perform most of my homemaking/job duties, but pain prevents me from performing more physically stressful activities (eg, lifting, vacuuming).  Total 22/50   Interpretation of scores: Score Category Description  0-20% Minimal Disability The patient can cope with most living activities. Usually no treatment is indicated apart from advice on lifting, sitting and exercise  21-40% Moderate Disability The patient experiences more pain and difficulty with sitting, lifting and standing. Travel and social life are more difficult and they may be disabled from work. Personal care, sexual activity and sleeping are not grossly affected, and the patient can usually be managed by conservative means  41-60% Severe Disability Pain remains the main problem in this group, but activities of daily living are affected. These patients require a detailed investigation   61-80% Crippled Back pain impinges on all aspects of the patients life. Positive intervention is required  81-100% Bed-bound These patients are either bed-bound or exaggerating their symptoms  Bluford FORBES Zoe DELENA Karon DELENA, et al. Surgery versus conservative management of stable thoracolumbar fracture: the PRESTO feasibility RCT. [place] ([place]): [employer]; 2021 Nov. [facility] Technology Assessment, No. 25.62.) Appendix 3, Oswestry Disability Index category descriptors. Available from: Findjewelers.cz  Minimally Clinically Important Difference (MCID) = 12.8%  COGNITION:  Overall cognitive status: Within functional limits for tasks assessed     SENSATION: WFL   POSTURE: rounded shoulders and forward head  PALPATION:  LUMBAR ROM:   AROM eval  Flexion Nil loss, No change in symptoms  Extension 25% loss, inc pain in lumbar and L hip  Right lateral flexion Nil loss  Left lateral flexion Nil loss  Right rotation   Left rotation    (Blank rows = not tested)  LOWER EXTREMITY ROM:   WNL BLE, except abd limited to 30 degrees bilaterally with add tightness noted, no change in symptoms   LOWER EXTREMITY MMT:    MMT Right eval Left eval  Hip flexion 5/5 4/5  Hip extension    Hip abduction 5/5 4/5  Hip adduction 5/5 5/5  Hip internal rotation    Hip external rotation    Knee flexion 5/5 4/5  Knee extension 5/5 4+/5  Ankle dorsiflexion 5/5 5/5  Ankle plantarflexion 5/5 5/5  Ankle inversion    Ankle eversion     (Blank rows = not tested)   TREATMENT DATE:   03/06/24- EVAL                                                                                                                                  PATIENT EDUCATION:  Education details: Pt educated on relevant anatomy, physiology, pathology, diagnosis, prognosis, progression of care, pain and activity modification related to low back pain Person educated: Patient and  neice Education method: Explanation, Demonstration, and Handouts Education comprehension: verbalized understanding and returned demonstration  HOME EXERCISE PROGRAM: Access Code: [email or web address] URL: [email or web address] Date: 03/06/2024 Prepared by: Stann Ohara  Exercises - Supine Bridge  - 1 x daily - 7 x weekly - 3 sets - 10 reps - 2 hold - Sit to Stand  - 1 x daily - 7 x weekly - 1 sets - 10 reps - 2 hold - Seated Sciatic Nerve Glide With Cervical Motion  - 1 x daily - 7 x weekly - 3 sets - 10 reps - 2 hold - Seated Piriformis Stretch with Trunk Bend  - 1 x daily - 7 x weekly - 1 sets - 30 reps - 2 hold  ASSESSMENT:  CLINICAL IMPRESSION: Patient is a 69 y.o. F who was seen today for physical therapy evaluation and treatment for low back pain. Symptoms are consistent with arthritic changes in the spinal facets and associated muscle guarding of the left hip complex, potential impingement to the sciatic nerve. HEP initiated to progress core strength and stability as well as address soft tissue tightness. Pt stands to benefit from continued skilled physical therapy to address deficit areas and restore safety with activities and participations at home and in the community.     OBJECTIVE IMPAIRMENTS: decreased activity tolerance, decreased endurance, decreased ROM, decreased strength, increased fascial restrictions, impaired perceived functional ability, increased muscle spasms, impaired sensation, and pain.   ACTIVITY LIMITATIONS:  lifting, sitting, standing, and sleeping  PARTICIPATION LIMITATIONS: cleaning, laundry, shopping, and community activity  PERSONAL FACTORS: Age, Past/current experiences, and Time since onset of injury/illness/exacerbation are also affecting patient's functional outcome.   REHAB POTENTIAL: Excellent  CLINICAL DECISION MAKING: Stable/uncomplicated  EVALUATION COMPLEXITY: Low   GOALS: Goals reviewed with patient? Yes  SHORT TERM GOALS: Target  date: 04/06/24   Pt will report compliance with HEP to work towards ind and home management strategies Baseline: Goal status: INITIAL   2.  Pt will score no greater than 12/50 on ODI to demonstrate improved activity tolerance Baseline:  Goal status: INITIAL   3.  Pt will improve lumbar spine ROM to full and painless in order to demonstrate progress towards activity tolerance and improved function Baseline:  Goal status: INITIAL      LONG TERM GOALS: Target date: 05/01/24   Pt will score no greater than 2/50 on ODI to demonstrate improved activity tolerance Baseline: 22/50 Goal status: INITIAL   2.  Pt will report no greater than 0/10 pain over 7 consecutive days to demonstrate maintained reduction in symptoms and improved tolerance to activity Baseline:  Goal status: INITIAL   3.  Pt will be ind in the management of their symptoms at home and in the community Baseline:  Goal status: INITIAL     PLAN:  PT FREQUENCY: 1x/week  PT DURATION: 8 weeks  PLANNED INTERVENTIONS: [id number]-Therapeutic exercises, 97530- Therapeutic activity, [id number]- Neuromuscular re-education, 97535- Self Care, [id number]- Manual therapy, [id number]- Electrical stimulation (unattended), [id number]- Vasopneumatic device, Patient/Family education, Cryotherapy, and Moist heat.  PLAN FOR NEXT SESSION: modify HEP as needed, address soft tissue tightness, core and lower quarter strength and stability, progress activity tolerance through functional movement patterns.   Stann DELENA Ohara, PT 03/06/2024, 12:20 PM  "

## 2024-03-10 ENCOUNTER — Encounter: Payer: Self-pay | Admitting: Physical Therapy

## 2024-03-10 ENCOUNTER — Ambulatory Visit: Admitting: Physical Therapy

## 2024-03-10 DIAGNOSIS — M5459 Other low back pain: Secondary | ICD-10-CM

## 2024-03-10 DIAGNOSIS — M79605 Pain in left leg: Secondary | ICD-10-CM

## 2024-03-10 DIAGNOSIS — M25552 Pain in left hip: Secondary | ICD-10-CM

## 2024-03-10 NOTE — Therapy (Signed)
 " OUTPATIENT PHYSICAL THERAPY THORACOLUMBAR TREATMENT   Patient Name: Molly Rodriguez MRN: [id number] DOB:1955/05/27, 69 y.o., female Today's Date: 03/10/2024  END OF SESSION:  PT End of Session - 03/10/24 1617     Visit Number 2    Number of Visits 9    Date for Recertification  05/01/24    Progress Note Due on Visit 9    PT Start Time 1615    PT Stop Time 1654    PT Time Calculation (min) 39 min    Activity Tolerance Patient tolerated treatment well    Behavior During Therapy [facility] for tasks assessed/performed           Past Medical History:  Diagnosis Date   Hypercholesteremia    Hypertension    History reviewed. No pertinent surgical history. Patient Active Problem List   Diagnosis Date Noted   Encounter for screening mammogram for malignant neoplasm of breast 09/28/2022   Need for vaccination against Streptococcus pneumoniae using pneumococcal conjugate vaccine 13 03/30/2022   Language barrier 04/04/2019   Seasonal allergies 10/01/2018   Skin rash 10/01/2018   Hyponatremia 12/15/2015   Hypokalemia 12/15/2015   History of alcohol dependence (HCC) 12/15/2015   Dysuria 11/12/2010   Hypertension 11/06/2010    PCP: Bascom Borer, NP  REFERRING PROVIDER: Kay Cummins, MD  REFERRING DIAG:  M54.50,G89.29 (ICD-10-CM) - Chronic left-sided low back pain, unspecified whether sciatica present    Lumbar facet arthritis   Rationale for Evaluation and Treatment: Rehabilitation  THERAPY DIAG:  Other low back pain  Pain in left hip  Pain in left leg  ONSET DATE: September 2025  SUBJECTIVE:                                                                                                                                                                                           SUBJECTIVE STATEMENT: Pt states that she is feeling better since initial eval. States that she gets L knee pain with prolonged walking. Reports compliance with HEP.   EVAL: Pt niece present to assist in  translation today. States that in September symptoms in her low back came about without MOI. She notes that symptoms have been overall unchanging. Has noted symptom inc with walking, prolonged sitting. Used to walk x60 mins and is now limited to 20-30 mins. Pt symptoms improve with rest. Reports that medications are not helping with symptoms, symptoms are dependent on activity or position vs time of day. Symptoms range in intensity from 0/10-10/10. Pt reports that she has L hip pain and will shoot down the left leg to the ankle.   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 Symptoms consistent with spinal etiology. Osteoarthritis mild. - Ordered physical therapy referral for core strengthening and stabilization exercises. - Recommended one to two physical therapy sessions to learn exercises, ongoing therapy optional. - Prescribed meloxicam  as needed for pain exacerbations. - Advised use of over-the-counter analgesics as needed.   Follow-Up Instructions: Return if symptoms worsen or fail to improve.   PAIN:  Are you having pain? Yes: NPRS scale: 5/10 Pain location: posterior left hip Pain description: shooting, ache Aggravating factors: walking, prolonged sitting Relieving factors: rest  PRECAUTIONS: None  RED FLAGS: None   WEIGHT BEARING RESTRICTIONS: No  FALLS:  Has patient fallen in last 6 months? No  LIVING ENVIRONMENT: Lives with: lives with their daughter Lives in: House/apartment Stairs: Yes: External: 2 steps; on right going up Has following equipment at home:  None  OCCUPATION: morning routine, walking dog, watching TV, exercise  PLOF: Independent  PATIENT GOALS: preserve and progress core strength, return to walking without pain  NEXT MD VISIT: PRN  OBJECTIVE:  Note: Objective measures were completed at Evaluation unless otherwise noted.  DIAGNOSTIC FINDINGS:  See imaging section of chart for details  PATIENT SURVEYS:  Modified Oswestry:  MODIFIED OSWESTRY DISABILITY SCALE  Date: 03/06/24 Score  Pain intensity 4 =  Pain medication provides me with little relief from pain.  2. Personal care (washing, dressing, etc.) 0 =  I can take care of myself normally without causing increased pain.  3. Lifting 1 = I can lift heavy weights, but it causes increased pain.  4. Walking 3 =  Pain prevents me from walking more than  mile.  5. Sitting 2 =  Pain prevents me from sitting more than 1 hour.  6. Standing 3 =  Pain prevents me from standing more than 1/2 hour.  7. Sleeping 1 = I can sleep well only by using pain medication.  8. Social Life 2 = Pain prevents me from participating in more energetic activities (eg. sports, dancing).  9. Traveling 4 = My pain restricts my travel to short necessary journeys under 1/2 hour.  10. Employment/ Homemaking 2 = I can perform most of my homemaking/job duties, but pain prevents me from performing more physically stressful activities (eg, lifting, vacuuming).  Total 22/50   Interpretation of scores: Score Category Description  0-20% Minimal Disability The patient can cope with most living activities. Usually no treatment is indicated apart from advice on lifting, sitting and exercise  21-40% Moderate Disability The patient experiences more pain and difficulty with sitting, lifting and standing. Travel and social life are more difficult and they may be disabled from work. Personal care, sexual activity and sleeping are not grossly affected, and the patient can usually be managed by conservative means  41-60%  Severe Disability Pain remains the main problem in this group, but activities of daily living are affected. These patients require a detailed investigation  61-80% Crippled Back pain impinges on all aspects of the patients life. Positive intervention is required  81-100% Bed-bound These patients are either bed-bound or exaggerating their symptoms  Bluford FORBES Zoe DELENA Karon DELENA, et al. Surgery versus conservative management of stable thoracolumbar fracture: the PRESTO feasibility RCT. [place] ([place]): [employer]; 2021 Nov. (Health Technology Assessment, No. 25.62.) Appendix 3, Oswestry Disability Index category descriptors. Available from: Findjewel[employer]cz  Minimally Clinically Important Difference (MCID) = 12.8%  COGNITION: Overall cognitive status: Within functional limits for tasks assessed     SENSATION: WFL   POSTURE: rounded shoulders and forward head  PALPATION:  LUMBAR ROM:   AROM eval  Flexion Nil loss, No change in symptoms  Extension 25% loss, inc pain in lumbar and L hip  Right lateral flexion Nil loss  Left lateral flexion Nil loss  Right rotation   Left rotation    (Blank rows = not tested)  LOWER EXTREMITY ROM:   WNL BLE, except abd limited to 30 degrees bilaterally with add tightness noted, no change in symptoms   LOWER EXTREMITY MMT:    MMT Right eval Left eval  Hip flexion 5/5 4/5  Hip extension    Hip abduction 5/5 4/5  Hip adduction 5/5 5/5  Hip internal rotation    Hip external rotation    Knee flexion 5/5 4/5  Knee extension 5/5 4+/5  Ankle dorsiflexion 5/5 5/5  Ankle plantarflexion 5/5 5/5  Ankle inversion    Ankle eversion     (Blank rows = not tested)   TREATMENT DATE:   [facility] Adult PT Treatment:                                                DATE: 03/10/24 Therapeutic Exercise: Recumbent bike x6 mins level 1 Clams in side lying 3x10 BLE Hamstring isometrics in sitting 10x10s BLE with physioball   Hamstring stretch x40, 2 sec hold BLE Seated physioball press x30, 3-5 sec hold Lower trunk rotation in mod hook lying BLE x30 Paloff press x30 both directions, blue TB Mini squat with KB 10# x1 set, 15# x2 sets, 3x10 total SLR 3x10 BLE     03/06/24- EVAL                                                                                                                                  PATIENT EDUCATION:  Education details: Pt educated on relevant anatomy, physiology, pathology, diagnosis, prognosis, progression of care, pain and activity modification related to low back pain Person educated: Patient and neice Education method: Explanation, Demonstration, and Handouts Education comprehension: verbalized understanding and returned demonstration  HOME EXERCISE PROGRAM: Access Code: [email or web address] URL: [email or web address] Date: 03/06/2024 Prepared by: Stann Ohara  Exercises - Supine Bridge  - 1 x daily - 7 x weekly - 3 sets - 10 reps - 2 hold - Sit to Stand  - 1 x daily - 7 x weekly - 1 sets - 10 reps - 2 hold - Seated Sciatic Nerve Glide With Cervical Motion  - 1 x daily - 7 x weekly - 3 sets - 10  reps - 2 hold - Seated Piriformis Stretch with Trunk Bend  - 1 x daily - 7 x weekly - 1 sets - 30 reps - 2 hold  ASSESSMENT:  CLINICAL IMPRESSION: Pt tolerated session well, able to progress through endurance and cyclic loading of LE and progressed hip and core strength with good tolerance and minimal symptom presence. Pt to continue with HEP rx at eval, and will assess response to todays level of activity at next session and update HEP as indicated. Pt stands to benefit from continued skilled physical therapy to address deficit areas and restore safety with activities and participations at home and in the community.     EVAL: Patient is a 69 y.o. F who was seen today for physical therapy evaluation and treatment for low back pain. Symptoms are consistent with arthritic changes  in the spinal facets and associated muscle guarding of the left hip complex, potential impingement to the sciatic nerve. HEP initiated to progress core strength and stability as well as address soft tissue tightness. Pt stands to benefit from continued skilled physical therapy to address deficit areas and restore safety with activities and participations at home and in the community.     OBJECTIVE IMPAIRMENTS: decreased activity tolerance, decreased endurance, decreased ROM, decreased strength, increased fascial restrictions, impaired perceived functional ability, increased muscle spasms, impaired sensation, and pain.   ACTIVITY LIMITATIONS: lifting, sitting, standing, and sleeping  PARTICIPATION LIMITATIONS: cleaning, laundry, shopping, and community activity  PERSONAL FACTORS: Age, Past/current experiences, and Time since onset of injury/illness/exacerbation are also affecting patient's functional outcome.   REHAB POTENTIAL: Excellent  CLINICAL DECISION MAKING: Stable/uncomplicated  EVALUATION COMPLEXITY: Low   GOALS: Goals reviewed with patient? Yes  SHORT TERM GOALS: Target date: 04/06/24   Pt will report compliance with HEP to work towards ind and home management strategies Baseline: Goal status: INITIAL   2.  Pt will score no greater than 12/50 on ODI to demonstrate improved activity tolerance Baseline:  Goal status: INITIAL   3.  Pt will improve lumbar spine ROM to full and painless in order to demonstrate progress towards activity tolerance and improved function Baseline:  Goal status: INITIAL      LONG TERM GOALS: Target date: 05/01/24   Pt will score no greater than 2/50 on ODI to demonstrate improved activity tolerance Baseline: 22/50 Goal status: INITIAL   2.  Pt will report no greater than 0/10 pain over 7 consecutive days to demonstrate maintained reduction in symptoms and improved tolerance to activity Baseline:  Goal status: INITIAL   3.  Pt will be ind in  the management of their symptoms at home and in the community Baseline:  Goal status: INITIAL     PLAN:  PT FREQUENCY: 1x/week  PT DURATION: 8 weeks  PLANNED INTERVENTIONS: [id number]-Therapeutic exercises, 97530- Therapeutic activity, [id number]- Neuromuscular re-education, 97535- Self Care, [id number]- Manual therapy, [id number]- Electrical stimulation (unattended), [id number]- Vasopneumatic device, Patient/Family education, Cryotherapy, and Moist heat.  PLAN FOR NEXT SESSION: modify HEP as needed, address soft tissue tightness, core and lower quarter strength and stability, progress activity tolerance through functional movement patterns.   Stann DELENA Ohara, PT 03/10/2024, 5:00 PM  "

## 2024-03-19 NOTE — Therapy (Incomplete)
 " OUTPATIENT PHYSICAL THERAPY THORACOLUMBAR TREATMENT   Patient Name: Molly Rodriguez MRN: [id number] DOB:[birth year], 69 y.o., female Today's Date: 03/19/2024  END OF SESSION:     Past Medical History:  Diagnosis Date   Hypercholesteremia    Hypertension    No past surgical history on file. Patient Active Problem List   Diagnosis Date Noted   Encounter for screening mammogram for malignant neoplasm of breast 09/28/2022   Need for vaccination against Streptococcus pneumoniae using pneumococcal conjugate vaccine 13 03/30/2022   Language barrier 04/04/2019   Seasonal allergies 10/01/2018   Skin rash 10/01/2018   Hyponatremia 12/15/2015   Hypokalemia 12/15/2015   History of alcohol dependence (HCC) 12/15/2015   Dysuria 11/12/2010   Hypertension 11/06/2010    PCP: Bascom Borer, NP  REFERRING PROVIDER: Kay Cummins, MD  REFERRING DIAG:  M54.50,G89.29 (ICD-10-CM) - Chronic left-sided low back pain, unspecified whether sciatica present    Lumbar facet arthritis   Rationale for Evaluation and Treatment: Rehabilitation  THERAPY DIAG:  No diagnosis found.  ONSET DATE: September 2025  SUBJECTIVE:                                                                                                                                                                                           SUBJECTIVE STATEMENT: *** Pt states that she is feeling better since initial eval. States that she gets L knee pain with prolonged walking. Reports compliance with HEP.   EVAL: Pt niece present to assist in translation today. States that in September symptoms in her low back came about without MOI. She notes that symptoms have been overall unchanging. Has noted symptom inc with walking, prolonged sitting. Used to walk x60 mins and is now limited to 20-30 mins. Pt symptoms improve with rest. Reports that medications are not helping with symptoms, symptoms are dependent on activity or position vs time of day.  Symptoms range in intensity from 0/10-10/10. Pt reports that she has L hip pain and will shoot down the left leg to the ankle.   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 Symptoms consistent with spinal etiology. Osteoarthritis mild. - Ordered physical therapy referral for core strengthening and stabilization exercises. - Recommended one to two physical therapy sessions to learn exercises, ongoing therapy optional. - Prescribed meloxicam  as needed for pain exacerbations. - Advised use of over-the-counter  analgesics as needed.   Follow-Up Instructions: Return if symptoms worsen or fail to improve.   PAIN:  Are you having pain? Yes: NPRS scale: 5/10 Pain location: posterior left hip Pain description: shooting, ache Aggravating factors: walking, prolonged sitting Relieving factors: rest  PRECAUTIONS: None  RED FLAGS: None   WEIGHT BEARING RESTRICTIONS: No  FALLS:  Has patient fallen in last 6 months? No  LIVING ENVIRONMENT: Lives with: lives with their daughter Lives in: House/apartment Stairs: Yes: External: 2 steps; on right going up Has following equipment at home: None  OCCUPATION: morning routine, walking dog, watching TV, exercise  PLOF: Independent  PATIENT GOALS: preserve and progress core strength, return to walking without pain  NEXT MD VISIT: PRN  OBJECTIVE:  Note: Objective measures were completed at Evaluation unless otherwise noted.  DIAGNOSTIC FINDINGS:  See imaging section of chart for details  PATIENT SURVEYS:  Modified Oswestry:  MODIFIED  OSWESTRY DISABILITY SCALE  Date: 03/06/24 Score  Pain intensity 4 =  Pain medication provides me with little relief from pain.  2. Personal care (washing, dressing, etc.) 0 =  I can take care of myself normally without causing increased pain.  3. Lifting 1 = I can lift heavy weights, but it causes increased pain.  4. Walking 3 =  Pain prevents me from walking more than  mile.  5. Sitting 2 =  Pain prevents me from sitting more than 1 hour.  6. Standing 3 =  Pain prevents me from standing more than 1/2 hour.  7. Sleeping 1 = I can sleep well only by using pain medication.  8. Social Life 2 = Pain prevents me from participating in more energetic activities (eg. sports, dancing).  9. Traveling 4 = My pain restricts my travel to short necessary journeys under 1/2 hour.  10. Employment/ Homemaking 2 = I can perform most of my homemaking/job duties, but pain prevents me from performing more physically stressful activities (eg, lifting, vacuuming).  Total 22/50   Interpretation of scores: Score Category Description  0-20% Minimal Disability The patient can cope with most living activities. Usually no treatment is indicated apart from advice on lifting, sitting and exercise  21-40% Moderate Disability The patient experiences more pain and difficulty with sitting, lifting and standing. Travel and social life are more difficult and they may be disabled from work. Personal care, sexual activity and sleeping are not grossly affected, and the patient can usually be managed by conservative means  41-60% Severe Disability Pain remains the main problem in this group, but activities of daily living are affected. These patients require a detailed investigation  61-80% Crippled Back pain impinges on all aspects of the patients life. Positive intervention is required  81-100% Bed-bound These patients are either bed-bound or exaggerating their symptoms  Bluford FORBES Zoe DELENA Karon DELENA, et al. Surgery versus  conservative management of stable thoracolumbar fracture: the PRESTO feasibility RCT. [place] ([place]): [employer]; 2021 Nov. [facility] Technology Assessment, No. 25.62.) Appendix 3, Oswestry Disability Index category descriptors. Available from: Findjewelers.cz  Minimally Clinically Important Difference (MCID) = 12.8%  COGNITION: Overall cognitive status: Within functional limits for tasks assessed     SENSATION: WFL   POSTURE: rounded shoulders and forward head  PALPATION:  LUMBAR ROM:   AROM eval  Flexion Nil loss, No change in symptoms  Extension 25% loss, inc pain in lumbar and L hip  Right lateral flexion Nil loss  Left lateral flexion Nil loss  Right rotation   Left  rotation    (Blank rows = not tested)  LOWER EXTREMITY ROM:   WNL BLE, except abd limited to 30 degrees bilaterally with add tightness noted, no change in symptoms   LOWER EXTREMITY MMT:    MMT Right eval Left eval  Hip flexion 5/5 4/5  Hip extension    Hip abduction 5/5 4/5  Hip adduction 5/5 5/5  Hip internal rotation    Hip external rotation    Knee flexion 5/5 4/5  Knee extension 5/5 4+/5  Ankle dorsiflexion 5/5 5/5  Ankle plantarflexion 5/5 5/5  Ankle inversion    Ankle eversion     (Blank rows = not tested)   TREATMENT DATE:   [facility] Adult PT Treatment:                                                DATE: 03/20/24 Therapeutic Exercise: *** Manual Therapy: *** Neuromuscular re-ed: *** Therapeutic Activity: *** Modalities: *** Self Care: ***   [facility] Adult PT Treatment:                                                DATE: 03/10/24 Therapeutic Exercise: Recumbent bike x6 mins level 1 Clams in side lying 3x10 BLE Hamstring isometrics in sitting 10x10s BLE with physioball  Hamstring stretch x40, 2 sec hold BLE Seated physioball press x30, 3-5 sec hold Lower trunk rotation in mod hook lying BLE x30 Paloff press x30 both directions, blue  TB Mini squat with KB 10# x1 set, 15# x2 sets, 3x10 total SLR 3x10 BLE     03/06/24- EVAL                                                                                                                                  PATIENT EDUCATION:  Education details: Pt educated on relevant anatomy, physiology, pathology, diagnosis, prognosis, progression of care, pain and activity modification related to low back pain Person educated: Patient and neice Education method: Explanation, Demonstration, and Handouts Education comprehension: verbalized understanding and returned demonstration  HOME EXERCISE PROGRAM: Access Code: [email or web address] URL: [email or web address] Date: 03/06/2024 Prepared by: Stann Ohara  Exercises - Supine Bridge  - 1 x daily - 7 x weekly - 3 sets - 10 reps - 2 hold - Sit to Stand  - 1 x daily - 7 x weekly - 1 sets - 10 reps - 2 hold - Seated Sciatic Nerve Glide With Cervical Motion  - 1 x daily - 7 x weekly - 3 sets - 10 reps - 2 hold - Seated Piriformis Stretch with Trunk Bend  - 1 x daily - 7 x weekly - 1 sets - 30  reps - 2 hold  ASSESSMENT:  CLINICAL IMPRESSION: *** Pt tolerated session well, able to progress through endurance and cyclic loading of LE and progressed hip and core strength with good tolerance and minimal symptom presence. Pt to continue with HEP rx at eval, and will assess response to todays level of activity at next session and update HEP as indicated. Pt stands to benefit from continued skilled physical therapy to address deficit areas and restore safety with activities and participations at home and in the community.     EVAL: Patient is a 69 y.o. F who was seen today for physical therapy evaluation and treatment for low back pain. Symptoms are consistent with arthritic changes in the spinal facets and associated muscle guarding of the left hip complex, potential impingement to the sciatic nerve. HEP initiated to progress core strength and  stability as well as address soft tissue tightness. Pt stands to benefit from continued skilled physical therapy to address deficit areas and restore safety with activities and participations at home and in the community.     OBJECTIVE IMPAIRMENTS: decreased activity tolerance, decreased endurance, decreased ROM, decreased strength, increased fascial restrictions, impaired perceived functional ability, increased muscle spasms, impaired sensation, and pain.   ACTIVITY LIMITATIONS: lifting, sitting, standing, and sleeping  PARTICIPATION LIMITATIONS: cleaning, laundry, shopping, and community activity  PERSONAL FACTORS: Age, Past/current experiences, and Time since onset of injury/illness/exacerbation are also affecting patient's functional outcome.   REHAB POTENTIAL: Excellent  CLINICAL DECISION MAKING: Stable/uncomplicated  EVALUATION COMPLEXITY: Low   GOALS: Goals reviewed with patient? Yes  SHORT TERM GOALS: Target date: 04/06/24   Pt will report compliance with HEP to work towards ind and home management strategies Baseline: Goal status: INITIAL   2.  Pt will score no greater than 12/50 on ODI to demonstrate improved activity tolerance Baseline:  Goal status: INITIAL   3.  Pt will improve lumbar spine ROM to full and painless in order to demonstrate progress towards activity tolerance and improved function Baseline:  Goal status: INITIAL      LONG TERM GOALS: Target date: 05/01/24   Pt will score no greater than 2/50 on ODI to demonstrate improved activity tolerance Baseline: 22/50 Goal status: INITIAL   2.  Pt will report no greater than 0/10 pain over 7 consecutive days to demonstrate maintained reduction in symptoms and improved tolerance to activity Baseline:  Goal status: INITIAL   3.  Pt will be ind in the management of their symptoms at home and in the community Baseline:  Goal status: INITIAL     PLAN:  PT FREQUENCY: 1x/week  PT DURATION: 8  weeks  PLANNED INTERVENTIONS: 97110-Therapeutic exercises, 97530- Therapeutic activity, [id number]- Neuromuscular re-education, 97535- Self Care, [id number]- Manual therapy, [id number]- Electrical stimulation (unattended), [id number]- Vasopneumatic device, Patient/Family education, Cryotherapy, and Moist heat.  PLAN FOR NEXT SESSION: modify HEP as needed, address soft tissue tightness, core and lower quarter strength and stability, progress activity tolerance through functional movement patterns.   Stann DELENA Ohara, PT 03/19/2024, 3:33 PM  "

## 2024-03-20 ENCOUNTER — Ambulatory Visit: Admitting: Physical Therapy

## 2024-03-24 ENCOUNTER — Encounter: Payer: Self-pay | Admitting: Nurse Practitioner

## 2024-03-26 ENCOUNTER — Ambulatory Visit

## 2024-03-26 NOTE — Therapy (Incomplete)
 " OUTPATIENT PHYSICAL THERAPY THORACOLUMBAR TREATMENT   Patient Name: Molly Rodriguez MRN: [id number] DOB:[birth year], 69 y.o., female Today's Date: 03/26/2024  END OF SESSION:     Past Medical History:  Diagnosis Date   Hypercholesteremia    Hypertension    No past surgical history on file. Patient Active Problem List   Diagnosis Date Noted   Encounter for screening mammogram for malignant neoplasm of breast 09/28/2022   Need for vaccination against Streptococcus pneumoniae using pneumococcal conjugate vaccine 13 03/30/2022   Language barrier 04/04/2019   Seasonal allergies 10/01/2018   Skin rash 10/01/2018   Hyponatremia 12/15/2015   Hypokalemia 12/15/2015   History of alcohol dependence (HCC) 12/15/2015   Dysuria 11/12/2010   Hypertension 11/06/2010    PCP: Bascom Borer, NP  REFERRING PROVIDER: Kay Cummins, MD  REFERRING DIAG:  M54.50,G89.29 (ICD-10-CM) - Chronic left-sided low back pain, unspecified whether sciatica present    Lumbar facet arthritis   Rationale for Evaluation and Treatment: Rehabilitation  THERAPY DIAG:  No diagnosis found.  ONSET DATE: September 2025  SUBJECTIVE:                                                                                                                                                                                           SUBJECTIVE STATEMENT: *** Pt states that she is feeling better since initial eval. States that she gets L knee pain with prolonged walking. Reports compliance with HEP.   EVAL: Pt niece present to assist in translation today. States that in September symptoms in her low back came about without MOI. She notes that symptoms have been overall unchanging. Has noted symptom inc with walking, prolonged sitting. Used to walk x60 mins and is now limited to 20-30 mins. Pt symptoms improve with rest. Reports that medications are not helping with symptoms, symptoms are dependent on activity or position vs time of day.  Symptoms range in intensity from 0/10-10/10. Pt reports that she has L hip pain and will shoot down the left leg to the ankle.   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 Symptoms consistent with spinal etiology. Osteoarthritis mild. - Ordered physical therapy referral for core strengthening and stabilization exercises. - Recommended one to two physical therapy sessions to learn exercises, ongoing therapy optional. - Prescribed meloxicam  as needed for pain exacerbations. - Advised use of over-the-counter  analgesics as needed.   Follow-Up Instructions: Return if symptoms worsen or fail to improve.   PAIN:  Are you having pain? Yes: NPRS scale: 5/10 Pain location: posterior left hip Pain description: shooting, ache Aggravating factors: walking, prolonged sitting Relieving factors: rest  PRECAUTIONS: None  RED FLAGS: None   WEIGHT BEARING RESTRICTIONS: No  FALLS:  Has patient fallen in last 6 months? No  LIVING ENVIRONMENT: Lives with: lives with their daughter Lives in: House/apartment Stairs: Yes: External: 2 steps; on right going up Has following equipment at home: None  OCCUPATION: morning routine, walking dog, watching TV, exercise  PLOF: Independent  PATIENT GOALS: preserve and progress core strength, return to walking without pain  NEXT MD VISIT: PRN  OBJECTIVE:  Note: Objective measures were completed at Evaluation unless otherwise noted.  DIAGNOSTIC FINDINGS:  See imaging section of chart for details  PATIENT SURVEYS:  Modified Oswestry:  MODIFIED  OSWESTRY DISABILITY SCALE  Date: 03/06/24 Score  Pain intensity 4 =  Pain medication provides me with little relief from pain.  2. Personal care (washing, dressing, etc.) 0 =  I can take care of myself normally without causing increased pain.  3. Lifting 1 = I can lift heavy weights, but it causes increased pain.  4. Walking 3 =  Pain prevents me from walking more than  mile.  5. Sitting 2 =  Pain prevents me from sitting more than 1 hour.  6. Standing 3 =  Pain prevents me from standing more than 1/2 hour.  7. Sleeping 1 = I can sleep well only by using pain medication.  8. Social Life 2 = Pain prevents me from participating in more energetic activities (eg. sports, dancing).  9. Traveling 4 = My pain restricts my travel to short necessary journeys under 1/2 hour.  10. Employment/ Homemaking 2 = I can perform most of my homemaking/job duties, but pain prevents me from performing more physically stressful activities (eg, lifting, vacuuming).  Total 22/50   Interpretation of scores: Score Category Description  0-20% Minimal Disability The patient can cope with most living activities. Usually no treatment is indicated apart from advice on lifting, sitting and exercise  21-40% Moderate Disability The patient experiences more pain and difficulty with sitting, lifting and standing. Travel and social life are more difficult and they may be disabled from work. Personal care, sexual activity and sleeping are not grossly affected, and the patient can usually be managed by conservative means  41-60% Severe Disability Pain remains the main problem in this group, but activities of daily living are affected. These patients require a detailed investigation  61-80% Crippled Back pain impinges on all aspects of the patients life. Positive intervention is required  81-100% Bed-bound These patients are either bed-bound or exaggerating their symptoms  Bluford FORBES Zoe DELENA Karon DELENA, et al. Surgery versus  conservative management of stable thoracolumbar fracture: the PRESTO feasibility RCT. [place] ([place]): [employer]; 2021 Nov. [facility] Technology Assessment, No. 25.62.) Appendix 3, Oswestry Disability Index category descriptors. Available from: Findjewelers.cz  Minimally Clinically Important Difference (MCID) = 12.8%  COGNITION: Overall cognitive status: Within functional limits for tasks assessed     SENSATION: WFL   POSTURE: rounded shoulders and forward head  PALPATION:  LUMBAR ROM:   AROM eval  Flexion Nil loss, No change in symptoms  Extension 25% loss, inc pain in lumbar and L hip  Right lateral flexion Nil loss  Left lateral flexion Nil loss  Right rotation   Left  rotation    (Blank rows = not tested)  LOWER EXTREMITY ROM:   WNL BLE, except abd limited to 30 degrees bilaterally with add tightness noted, no change in symptoms   LOWER EXTREMITY MMT:    MMT Right eval Left eval  Hip flexion 5/5 4/5  Hip extension    Hip abduction 5/5 4/5  Hip adduction 5/5 5/5  Hip internal rotation    Hip external rotation    Knee flexion 5/5 4/5  Knee extension 5/5 4+/5  Ankle dorsiflexion 5/5 5/5  Ankle plantarflexion 5/5 5/5  Ankle inversion    Ankle eversion     (Blank rows = not tested)   TREATMENT DATE:   [facility] Adult PT Treatment:                                                DATE: 03/20/24 Therapeutic Exercise: Recumbent bike x6 mins level 1 Lower trunk rotation in mod hook lying BLE x30 Neuromuscular re-ed: Clams in side lying 3x10 BLE Hamstring isometrics in sitting 10x10s BLE with physioball  Hamstring stretch x40, 2 sec hold BLE Seated physioball press x30, 3-5 sec hold Paloff press x30 both directions, blue TB Therapeutic Activity: Mini squat with KB 10# x1 set, 15# x2 sets, 3x10 total SLR 3x10 BLE    [facility] Adult PT Treatment:                                                DATE: 03/10/24 Therapeutic  Exercise: Recumbent bike x6 mins level 1 Clams in side lying 3x10 BLE Hamstring isometrics in sitting 10x10s BLE with physioball  Hamstring stretch x40, 2 sec hold BLE Seated physioball press x30, 3-5 sec hold Lower trunk rotation in mod hook lying BLE x30 Paloff press x30 both directions, blue TB Mini squat with KB 10# x1 set, 15# x2 sets, 3x10 total SLR 3x10 BLE   PATIENT EDUCATION:  Education details: Pt educated on relevant anatomy, physiology, pathology, diagnosis, prognosis, progression of care, pain and activity modification related to low back pain Person educated: Patient and neice Education method: Explanation, Demonstration, and Handouts Education comprehension: verbalized understanding and returned demonstration  HOME EXERCISE PROGRAM: Access Code: [email or web address] URL: [email or web address] Date: 03/06/2024 Prepared by: Stann Ohara  Exercises - Supine Bridge  - 1 x daily - 7 x weekly - 3 sets - 10 reps - 2 hold - Sit to Stand  - 1 x daily - 7 x weekly - 1 sets - 10 reps - 2 hold - Seated Sciatic Nerve Glide With Cervical Motion  - 1 x daily - 7 x weekly - 3 sets - 10 reps - 2 hold - Seated Piriformis Stretch with Trunk Bend  - 1 x daily - 7 x weekly - 1 sets - 30 reps - 2 hold  ASSESSMENT:  CLINICAL IMPRESSION: ***  Pt tolerated session well, able to progress through endurance and cyclic loading of LE and progressed hip and core strength with good tolerance and minimal symptom presence. Pt to continue with HEP rx at eval, and will assess response to todays level of activity at next session and update HEP as indicated. Pt stands to benefit from continued skilled physical therapy to address deficit areas and  restore safety with activities and participations at home and in the community.     EVAL: Patient is a 69 y.o. F who was seen today for physical therapy evaluation and treatment for low back pain. Symptoms are consistent with arthritic changes in the  spinal facets and associated muscle guarding of the left hip complex, potential impingement to the sciatic nerve. HEP initiated to progress core strength and stability as well as address soft tissue tightness. Pt stands to benefit from continued skilled physical therapy to address deficit areas and restore safety with activities and participations at home and in the community.     OBJECTIVE IMPAIRMENTS: decreased activity tolerance, decreased endurance, decreased ROM, decreased strength, increased fascial restrictions, impaired perceived functional ability, increased muscle spasms, impaired sensation, and pain.   ACTIVITY LIMITATIONS: lifting, sitting, standing, and sleeping  PARTICIPATION LIMITATIONS: cleaning, laundry, shopping, and community activity  PERSONAL FACTORS: Age, Past/current experiences, and Time since onset of injury/illness/exacerbation are also affecting patient's functional outcome.   REHAB POTENTIAL: Excellent  CLINICAL DECISION MAKING: Stable/uncomplicated  EVALUATION COMPLEXITY: Low   GOALS: Goals reviewed with patient? Yes  SHORT TERM GOALS: Target date: 04/06/24   Pt will report compliance with HEP to work towards ind and home management strategies Baseline: Goal status: INITIAL   2.  Pt will score no greater than 12/50 on ODI to demonstrate improved activity tolerance Baseline:  Goal status: INITIAL   3.  Pt will improve lumbar spine ROM to full and painless in order to demonstrate progress towards activity tolerance and improved function Baseline:  Goal status: INITIAL      LONG TERM GOALS: Target date: 05/01/24   Pt will score no greater than 2/50 on ODI to demonstrate improved activity tolerance Baseline: 22/50 Goal status: INITIAL   2.  Pt will report no greater than 0/10 pain over 7 consecutive days to demonstrate maintained reduction in symptoms and improved tolerance to activity Baseline:  Goal status: INITIAL   3.  Pt will be ind in the  management of their symptoms at home and in the community Baseline:  Goal status: INITIAL     PLAN:  PT FREQUENCY: 1x/week  PT DURATION: 8 weeks  PLANNED INTERVENTIONS: 97110-Therapeutic exercises, 97530- Therapeutic activity, [id number]- Neuromuscular re-education, 97535- Self Care, [id number]- Manual therapy, [id number]- Electrical stimulation (unattended), [id number]- Vasopneumatic device, Patient/Family education, Cryotherapy, and Moist heat.  PLAN FOR NEXT SESSION: modify HEP as needed, address soft tissue tightness, core and lower quarter strength and stability, progress activity tolerance through functional movement patterns.   Corean Pouch, PTA 03/26/2024, 8:14 AM   "

## 2024-03-31 ENCOUNTER — Ambulatory Visit

## 2024-05-29 ENCOUNTER — Ambulatory Visit: Payer: Self-pay | Admitting: Nurse Practitioner

## 2024-06-05 ENCOUNTER — Ambulatory Visit: Admitting: Nurse Practitioner
